# Patient Record
Sex: Male | Born: 1955 | Race: White | Hispanic: No | Marital: Married | Smoking: Never smoker
Health system: Southern US, Community
[De-identification: ages and names within clinical notes are randomized; demographics above are authoritative.]

## PROBLEM LIST (undated history)

## (undated) DIAGNOSIS — E119 Type 2 diabetes mellitus without complications: Secondary | ICD-10-CM

## (undated) DIAGNOSIS — I10 Essential (primary) hypertension: Secondary | ICD-10-CM

## (undated) DIAGNOSIS — E7800 Pure hypercholesterolemia, unspecified: Secondary | ICD-10-CM

## (undated) DIAGNOSIS — M109 Gout, unspecified: Secondary | ICD-10-CM

## (undated) DIAGNOSIS — E669 Obesity, unspecified: Secondary | ICD-10-CM

## (undated) DIAGNOSIS — N189 Chronic kidney disease, unspecified: Secondary | ICD-10-CM

## (undated) HISTORY — PX: DENTAL SURGERY: SHX609

## (undated) HISTORY — DX: Pure hypercholesterolemia, unspecified: E78.00

## (undated) HISTORY — DX: Chronic kidney disease, unspecified: N18.9

## (undated) HISTORY — DX: Essential (primary) hypertension: I10

## (undated) HISTORY — PX: CIRCUMCISION: SUR203

---

## 2012-11-26 ENCOUNTER — Emergency Department (HOSPITAL_COMMUNITY)
Admission: EM | Admit: 2012-11-26 | Discharge: 2012-11-26 | Disposition: A | Discharge status: Home / Self Care | Payer: Worker's Compensation | Attending: Emergency Medicine | Admitting: Emergency Medicine

## 2012-11-26 ENCOUNTER — Emergency Department (HOSPITAL_COMMUNITY): Payer: Worker's Compensation

## 2012-11-26 ENCOUNTER — Encounter (HOSPITAL_COMMUNITY): Payer: Self-pay | Admitting: *Deleted

## 2012-11-26 DIAGNOSIS — E669 Obesity, unspecified: Secondary | ICD-10-CM | POA: Insufficient documentation

## 2012-11-26 DIAGNOSIS — R0789 Other chest pain: Secondary | ICD-10-CM

## 2012-11-26 DIAGNOSIS — R1013 Epigastric pain: Secondary | ICD-10-CM

## 2012-11-26 DIAGNOSIS — Z7982 Long term (current) use of aspirin: Secondary | ICD-10-CM | POA: Insufficient documentation

## 2012-11-26 DIAGNOSIS — M109 Gout, unspecified: Secondary | ICD-10-CM | POA: Insufficient documentation

## 2012-11-26 DIAGNOSIS — Z79899 Other long term (current) drug therapy: Secondary | ICD-10-CM | POA: Insufficient documentation

## 2012-11-26 DIAGNOSIS — R072 Precordial pain: Secondary | ICD-10-CM | POA: Insufficient documentation

## 2012-11-26 DIAGNOSIS — I10 Essential (primary) hypertension: Secondary | ICD-10-CM | POA: Insufficient documentation

## 2012-11-26 HISTORY — DX: Obesity, unspecified: E66.9

## 2012-11-26 HISTORY — DX: Essential (primary) hypertension: I10

## 2012-11-26 HISTORY — DX: Gout, unspecified: M10.9

## 2012-11-26 LAB — POCT I-STAT TROPONIN I: Troponin i, poc: 0.01 ng/mL (ref 0.00–0.08)

## 2012-11-26 LAB — CBC
Hemoglobin: 15.6 g/dL (ref 13.0–17.0)
MCHC: 35.3 g/dL (ref 30.0–36.0)
WBC: 15.1 10*3/uL — ABNORMAL HIGH (ref 4.0–10.5)

## 2012-11-26 LAB — BASIC METABOLIC PANEL
Chloride: 101 mEq/L (ref 96–112)
GFR calc Af Amer: 42 mL/min — ABNORMAL LOW (ref >90)
GFR calc non Af Amer: 36 mL/min — ABNORMAL LOW (ref >90)
Glucose, Bld: 110 mg/dL — ABNORMAL HIGH (ref 70–99)
Potassium: 4.4 mEq/L (ref 3.5–5.1)
Sodium: 135 mEq/L (ref 135–145)

## 2012-11-26 MED ORDER — PANTOPRAZOLE SODIUM 20 MG PO TBEC: 20.0000 mg | DELAYED_RELEASE_TABLET | Freq: Every day | ORAL | Status: DC

## 2012-11-26 MED ORDER — SODIUM CHLORIDE 0.9 % IV BOLUS (SEPSIS)
500.0000 mL | Freq: Once | INTRAVENOUS | Status: AC
  Administered 2012-11-26: 500 mL via INTRAVENOUS

## 2012-11-26 MED ORDER — GI COCKTAIL ~~LOC~~
30.0000 mL | Freq: Once | ORAL | Status: AC
  Administered 2012-11-26: 30 mL via ORAL
  Filled 2012-11-26: qty 30

## 2012-11-26 NOTE — ED Provider Notes (Signed)
CSN: [id number]     Arrival date & time 11/26/12  1134 History   First MD Initiated Contact with Patient 11/26/12 1244     Chief Complaint  Patient presents with  . Chest Pain   (Consider location/radiation/quality/duration/timing/severity/associated sxs/prior Treatment) HPI   57 year old obese male with history of hypertension, and gout presents complaining of chest pain. Patient reports 4 hours ago he had an acute onset of epigastric abdominal pain/mid chest pain lasting for about 3 hours and resolved without any treatment. Pain is described as a burning sensation. Patient was diaphoretic. 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 He notified his boss man who brought pt to the ER.  Otherwise patient denies any fever, chills, headache, neck pain, shortness of breath, lightheadedness, dizziness, nausea, vomiting, diarrhea, back pain, numbness or weakness. Does have history of gout and take NSAIDs for. Denies any prior history of peptic ulcers or any significant history of cardiac disease aside from hypertension. No prior cardiac workup. No recent steroid use.  Currently completely symptom free.  Ate cheese last night.  No significant alcohol use.  Denies hematemesis.    Past Medical History  Diagnosis Date  . Obesity   . Hypertension   . Gout    History reviewed. No pertinent past surgical history. History reviewed. No pertinent family history. History  Substance Use Topics  . Smoking status: Not on file  . Smokeless tobacco: Not on file  . Alcohol Use: Yes     Comment: beer    Review of Systems  All other systems reviewed and are negative.    Allergies  Review of patient's allergies indicates no known allergies.  Home Medications   Current Outpatient Rx  Name  Route  Sig  Dispense  Refill  . allopurinol (ZYLOPRIM) 300 MG tablet   Oral   Take 300 mg by mouth daily.         Marland Kitchen aspirin (ASPIRIN EC) 81 MG EC tablet   Oral   Take 81  mg by mouth daily. Swallow whole.         Marland Kitchen atenolol (TENORMIN) 25 MG tablet   Oral   Take 25 mg by mouth daily.         . indomethacin (INDOCIN) 50 MG capsule   Oral   Take 50 mg by mouth 3 (three) times daily as needed (pain).         . valsartan-hydrochlorothiazide (DIOVAN-HCT) 320-12.5 MG per tablet   Oral   Take 1 tablet by mouth daily.          BP 103/58  Pulse 78  Temp(Src) 98 F (36.7 C) (Oral)  Resp 18  SpO2 98% Physical Exam  Nursing note and vitals reviewed. Constitutional: He appears well-developed and well-nourished. No distress.  Awake, alert, nontoxic appearance  HENT:  Head: Atraumatic.  Eyes: Conjunctivae are normal. Right eye exhibits no discharge. Left eye exhibits no discharge.  Neck: Normal range of motion. Neck supple.  Cardiovascular: Normal rate, regular rhythm and intact distal pulses.   Pulmonary/Chest: Effort normal. No respiratory distress. He exhibits no tenderness.  Abdominal: Soft. There is no tenderness. There is no rebound.  Musculoskeletal: He exhibits no edema and no tenderness.  ROM appears intact, no obvious focal weakness  Neurological: He is alert.  Skin: Skin is warm and dry. No rash noted.  Psychiatric: He has a normal mood and affect.    ED Course  Procedures (including critical care  time)   Date: 11/26/2012  Rate: 75  Rhythm: normal sinus rhythm  QRS Axis: normal  Intervals: normal  ST/T Wave abnormalities: normal  Conduction Disutrbances:none  Narrative Interpretation:   Old EKG Reviewed: none available  1:18 PM Patient presents with epigastric abdominal pain. Pain is completely resolved. Symptoms suggestive of GERD or peptic ulcer disease. Initial EKG, troponin, and chest x-ray shows no acute finding. Plan to obtain delta troponin. Patient does have abnormal kidney functions with BUN 41, creatinine 1.98, GFR 36 of 36. No prior value for baseline comparison.  Will give IVF.  Pt also has elevated WBC 15.1 but does  not have fever, chills, or any other complaint suggestive of infectious etiology.    3:40 PM Patient has remained symptom free the past 3 hours. Negative delta troponin. Plan to have patient follow closely with primary care Dr. for further evaluation. Patient will also need to have his renal function rechecked by his primary care Dr. Clista Bernhardt also need to have his white blood count rechecked to ensure resolution. Patient is aware of plan. Return precautions discussed. Otherwise patient stable for discharge. Labs Review Labs Reviewed  CBC - Abnormal; Notable for the following:    WBC 15.1 (*)    All other components within normal limits  BASIC METABOLIC PANEL - Abnormal; Notable for the following:    Glucose, Bld 110 (*)    BUN 41 (*)    Creatinine, Ser 1.98 (*)    GFR calc non Af Amer 36 (*)    GFR calc Af Amer 42 (*)    All other components within normal limits  POCT I-STAT TROPONIN I  POCT I-STAT TROPONIN I   Imaging Review Dg Chest 2 View  11/26/2012   *RADIOLOGY REPORT*  Clinical Data: Chest pain  CHEST - 2 VIEW  Comparison: None.  Findings: There is no edema or consolidation.  Lungs are slightly hyperexpanded.  Heart size and pulmonary vascularity are normal. No adenopathy.  There is degenerative change in the thoracic spine.  IMPRESSION: Lungs mildly hyperexpanded.  No edema or consolidation.   Original Report Authenticated By: Bretta Bang, M.D.    MDM   1. Chest pain, mid sternal   2. Epigastric abdominal pain    BP 103/58  Pulse 78  Temp(Src) 98 F (36.7 C) (Oral)  Resp 18  SpO2 98%  I have reviewed nursing notes and vital signs. I personally reviewed the imaging tests through PACS system  I reviewed available ER/hospitalization records thought the EMR     Fayrene Helper, [place] 11/26/12 1542

## 2012-11-26 NOTE — ED Provider Notes (Signed)
Medical screening examination/treatment/procedure(s) were conducted as a shared visit with non-physician practitioner(s) and myself.  I personally evaluated the patient during the encounter  27M with epigastric pain - burning, radiating into chest. Likely GERD. Symptom free here. EKG normal. Normal exam. Delta trop normal. Instructed f/u with PCP for stress.  Dagmar Hait, MD 11/26/12 [phone]

## 2012-11-26 NOTE — ED Notes (Signed)
Pt discharged.Vital signs stable and GCS 15 

## 2012-11-26 NOTE — ED Notes (Signed)
Reports being at work and onset of fatigue, weakness, diaphoresis, dizziness and burning mid chest pains. Denies syncope or falls. ekg done at triage. No acute distress noted.

## 2014-10-05 ENCOUNTER — Encounter (HOSPITAL_COMMUNITY): Payer: Self-pay | Admitting: Emergency Medicine

## 2014-10-05 ENCOUNTER — Emergency Department (HOSPITAL_COMMUNITY)
Admission: EM | Admit: 2014-10-05 | Discharge: 2014-10-05 | Disposition: A | Discharge status: Home / Self Care | Payer: PRIVATE HEALTH INSURANCE | Attending: Emergency Medicine | Admitting: Emergency Medicine

## 2014-10-05 ENCOUNTER — Emergency Department (HOSPITAL_COMMUNITY): Payer: PRIVATE HEALTH INSURANCE

## 2014-10-05 DIAGNOSIS — R109 Unspecified abdominal pain: Secondary | ICD-10-CM

## 2014-10-05 DIAGNOSIS — Z79899 Other long term (current) drug therapy: Secondary | ICD-10-CM | POA: Diagnosis not present

## 2014-10-05 DIAGNOSIS — M109 Gout, unspecified: Secondary | ICD-10-CM | POA: Diagnosis not present

## 2014-10-05 DIAGNOSIS — E669 Obesity, unspecified: Secondary | ICD-10-CM | POA: Insufficient documentation

## 2014-10-05 DIAGNOSIS — Z7982 Long term (current) use of aspirin: Secondary | ICD-10-CM | POA: Insufficient documentation

## 2014-10-05 DIAGNOSIS — M47818 Spondylosis without myelopathy or radiculopathy, sacral and sacrococcygeal region: Secondary | ICD-10-CM

## 2014-10-05 DIAGNOSIS — I10 Essential (primary) hypertension: Secondary | ICD-10-CM | POA: Insufficient documentation

## 2014-10-05 DIAGNOSIS — M1990 Unspecified osteoarthritis, unspecified site: Secondary | ICD-10-CM | POA: Diagnosis not present

## 2014-10-05 LAB — BASIC METABOLIC PANEL
Anion gap: 7 (ref 5–15)
BUN: 30 mg/dL — AB (ref 6–20)
CALCIUM: 9.4 mg/dL (ref 8.9–10.3)
CHLORIDE: 108 mmol/L (ref 101–111)
CO2: 23 mmol/L (ref 22–32)
CREATININE: 1.07 mg/dL (ref 0.61–1.24)
GFR calc Af Amer: >60 mL/min (ref >60)
GLUCOSE: 119 mg/dL — AB (ref 65–99)
POTASSIUM: 4.3 mmol/L (ref 3.5–5.1)
SODIUM: 138 mmol/L (ref 135–145)

## 2014-10-05 LAB — URINALYSIS, ROUTINE W REFLEX MICROSCOPIC
Bilirubin Urine: NEGATIVE
Glucose, UA: NEGATIVE mg/dL
HGB URINE DIPSTICK: NEGATIVE
KETONES UR: NEGATIVE mg/dL
Leukocytes, UA: NEGATIVE
NITRITE: NEGATIVE
PH: 5 (ref 5.0–8.0)
Protein, ur: NEGATIVE mg/dL
SPECIFIC GRAVITY, URINE: 1.021 (ref 1.005–1.030)
UROBILINOGEN UA: 0.2 mg/dL (ref 0.0–1.0)

## 2014-10-05 LAB — CBC WITH DIFFERENTIAL/PLATELET
BASOS PCT: 0 % (ref 0–1)
Basophils Absolute: 0 10*3/uL (ref 0.0–0.1)
Eosinophils Absolute: 0.3 10*3/uL (ref 0.0–0.7)
Eosinophils Relative: 4 % (ref 0–5)
HEMATOCRIT: 45.7 % (ref 39.0–52.0)
Hemoglobin: 14.9 g/dL (ref 13.0–17.0)
LYMPHS ABS: 1.6 10*3/uL (ref 0.7–4.0)
LYMPHS PCT: 20 % (ref 12–46)
MCH: 30.2 pg (ref 26.0–34.0)
MCHC: 32.6 g/dL (ref 30.0–36.0)
MCV: 92.7 fL (ref 78.0–100.0)
MONOS PCT: 11 % (ref 3–12)
Monocytes Absolute: 0.9 10*3/uL (ref 0.1–1.0)
NEUTROS ABS: 5.2 10*3/uL (ref 1.7–7.7)
Neutrophils Relative %: 65 % (ref 43–77)
PLATELETS: 148 10*3/uL — AB (ref 150–400)
RBC: 4.93 MIL/uL (ref 4.22–5.81)
RDW: 13.9 % (ref 11.5–15.5)
WBC: 8.1 10*3/uL (ref 4.0–10.5)

## 2014-10-05 MED ORDER — IBUPROFEN 600 MG PO TABS: 600.0000 mg | ORAL_TABLET | Freq: Three times a day (TID) | ORAL | Status: DC | PRN

## 2014-10-05 MED ORDER — MORPHINE SULFATE 4 MG/ML IJ SOLN: 4.0000 mg | Freq: Once | INTRAMUSCULAR | Status: DC

## 2014-10-05 NOTE — ED Notes (Signed)
Pt c/o left flank pain, has known kidney stones. States urologist couldn't do CT until tomorrow but pain is too much.

## 2014-10-05 NOTE — ED Notes (Signed)
Patient in radiology

## 2014-10-05 NOTE — ED Notes (Signed)
MD at bedside. EDP CAMPOS

## 2014-10-05 NOTE — ED Notes (Signed)
Unable to collect labs patient is not in room 

## 2014-10-05 NOTE — ED Provider Notes (Signed)
CSN: [id number]     Arrival date & time 10/05/14  1239 History   First MD Initiated Contact with Patient 10/05/14 1527     Chief Complaint  Patient presents with  . Flank Pain      HPI Patient reports left flank pain over the past 24 hours.  He states it is worse when he ambulates and sits forward.  He denies nausea vomiting.  No fevers or chills.  Denies dysuria or urinary frequency.  He states he has a known kidney stone in his left kidney for which he's been following for some time.  His pain is moderate in severity.  He tried ibuprofen without improvement.   Past Medical History  Diagnosis Date  . Obesity   . Hypertension   . Gout    History reviewed. No pertinent past surgical history. History reviewed. No pertinent family history. History  Substance Use Topics  . Smoking status: Not on file  . Smokeless tobacco: Not on file  . Alcohol Use: Yes     Comment: beer    Review of Systems  All other systems reviewed and are negative.     Allergies  Review of patient's allergies indicates no known allergies.  Home Medications   Prior to Admission medications   Medication Sig Start Date End Date Taking? Authorizing Provider  allopurinol (ZYLOPRIM) 300 MG tablet Take 300 mg by mouth daily.   Yes Historical Provider, MD  aspirin (ASPIRIN EC) 81 MG EC tablet Take 81 mg by mouth daily. Swallow whole.   Yes Historical Provider, MD  atenolol (TENORMIN) 25 MG tablet Take 25 mg by mouth daily.   Yes Historical Provider, MD  indomethacin (INDOCIN) 50 MG capsule Take 50 mg by mouth 3 (three) times daily as needed (pain).   Yes Historical Provider, MD  valsartan-hydrochlorothiazide (DIOVAN-HCT) 320-12.5 MG per tablet Take 1 tablet by mouth daily.   Yes Historical Provider, MD  ibuprofen (ADVIL,MOTRIN) 600 MG tablet Take 1 tablet (600 mg total) by mouth every 8 (eight) hours as needed. 10/05/14   Jola Schmidt, MD  pantoprazole (PROTONIX) 20 MG tablet Take 1 tablet (20 mg total) by  mouth daily. Patient not taking: Reported on 10/05/2014 11/26/12   Domenic Moras, PA-C   BP 116/66 mmHg  Pulse 61  Temp(Src) 97.9 F (36.6 C) (Oral)  Resp 18  SpO2 99% Physical Exam  Constitutional: He is oriented to person, place, and time. He appears well-developed and well-nourished.  HENT:  Head: Normocephalic and atraumatic.  Eyes: EOM are normal.  Neck: Normal range of motion.  Cardiovascular: Normal rate, regular rhythm, normal heart sounds and intact distal pulses.   Pulmonary/Chest: Effort normal and breath sounds normal. No respiratory distress.  Abdominal: Soft. He exhibits no distension. There is no tenderness.  Musculoskeletal: Normal range of motion.  Neurological: He is alert and oriented to person, place, and time.  Skin: Skin is warm and dry.  Psychiatric: He has a normal mood and affect. Judgment normal.  Nursing note and vitals reviewed.   ED Course  Procedures (including critical care time) Labs Review Labs Reviewed  CBC WITH DIFFERENTIAL/PLATELET - Abnormal; Notable for the following:    Platelets 148 (*)    All other components within normal limits  BASIC METABOLIC PANEL - Abnormal; Notable for the following:    Glucose, Bld 119 (*)    BUN 30 (*)    All other components within normal limits  URINALYSIS, ROUTINE W REFLEX MICROSCOPIC (NOT AT [facility])  Imaging Review Ct Renal Stone Study  10/05/2014   CLINICAL DATA:  Left flank pain.  Renal calculi.  EXAM: CT ABDOMEN AND PELVIS WITHOUT CONTRAST  TECHNIQUE: Multidetector CT imaging of the abdomen and pelvis was performed following the standard protocol without IV contrast.  COMPARISON:  Multiple exams, including 09/06/2011 and 01/21/2011  FINDINGS: Lower chest:  Unremarkable  Hepatobiliary: Diffuse hepatic steatosis.  Pancreas: Unremarkable  Spleen: Unremarkable  Adrenals/Urinary Tract: 4 mm right mid to lower kidney nonobstructive calculus. No additional calculi. No hydronephrosis or hydroureter. Urinary bladder  unremarkable.  Stomach/Bowel: Unremarkable.  Appendix normal.  Vascular/Lymphatic: Aortoiliac atherosclerotic vascular disease.  Reproductive: Unremarkable  Other: No supplemental non-categorized findings.  Musculoskeletal: Bridging spurring of the sacroiliac joints. Bilateral facet arthropathy at L5-S1, no overt lumbar impingement.  IMPRESSION: 1. There is a 4 mm right mid to lower kidney nonobstructive calculus but no other calculi are identified. No hydronephrosis, hydroureter, or other findings to explain the patient's left flank pain. 2. Diffuse hepatic steatosis. 3.  Aortoiliac atherosclerotic vascular disease. 4. Spurring of the sacroiliac joints and bilateral facet arthropathy at L5-S1.   Electronically Signed   By: Van Clines M.D.   On: 10/05/2014 16:15  I personally reviewed the imaging tests through PACS system I reviewed available ER/hospitalization records through the EMR    EKG Interpretation None      MDM   Final diagnoses:  Left flank pain  SI joint arthritis    Likely arthritis of his SI joint is a cause of his pain given that is worse with ambulation and movement.  Nephrolithiasis noted without ureterolithiasis.  Well appearing.  Vitals normal.  Labs and urine without significant abnormality.  Primary care follow-up.  Patient understands to return to the ER for new or worsening symptoms    Jola Schmidt, MD 10/06/14 [phone]

## 2014-10-05 NOTE — ED Notes (Signed)
Patient still not in room

## 2016-12-11 ENCOUNTER — Encounter (INDEPENDENT_AMBULATORY_CARE_PROVIDER_SITE_OTHER): Payer: Self-pay

## 2016-12-11 ENCOUNTER — Encounter (INDEPENDENT_AMBULATORY_CARE_PROVIDER_SITE_OTHER): Payer: Self-pay | Admitting: Internal Medicine

## 2017-01-08 ENCOUNTER — Encounter (INDEPENDENT_AMBULATORY_CARE_PROVIDER_SITE_OTHER): Payer: Self-pay | Admitting: Internal Medicine

## 2017-01-08 ENCOUNTER — Other Ambulatory Visit (INDEPENDENT_AMBULATORY_CARE_PROVIDER_SITE_OTHER): Payer: Self-pay | Admitting: Internal Medicine

## 2017-01-08 ENCOUNTER — Ambulatory Visit (INDEPENDENT_AMBULATORY_CARE_PROVIDER_SITE_OTHER): Payer: PRIVATE HEALTH INSURANCE | Admitting: Internal Medicine

## 2017-01-08 VITALS — BP 150/80 | HR 76 | Temp 98.2°F | Ht 74.0 in | Wt 216.0 lb

## 2017-01-08 DIAGNOSIS — I10 Essential (primary) hypertension: Secondary | ICD-10-CM | POA: Insufficient documentation

## 2017-01-08 DIAGNOSIS — R195 Other fecal abnormalities: Secondary | ICD-10-CM | POA: Diagnosis not present

## 2017-01-08 DIAGNOSIS — E7800 Pure hypercholesterolemia, unspecified: Secondary | ICD-10-CM

## 2017-01-08 DIAGNOSIS — N189 Chronic kidney disease, unspecified: Secondary | ICD-10-CM

## 2017-01-08 DIAGNOSIS — Z8371 Family history of colonic polyps: Secondary | ICD-10-CM

## 2017-01-08 HISTORY — DX: Chronic kidney disease, unspecified: N18.9

## 2017-01-08 HISTORY — DX: Pure hypercholesterolemia, unspecified: E78.00

## 2017-01-08 HISTORY — DX: Essential (primary) hypertension: I10

## 2017-01-08 LAB — CBC WITH DIFFERENTIAL/PLATELET
BASOS PCT: 0.8 %
Basophils Absolute: 77 cells/uL (ref 0–200)
EOS ABS: 298 {cells}/uL (ref 15–500)
Eosinophils Relative: 3.1 %
HCT: 46.8 % (ref 38.5–50.0)
HEMOGLOBIN: 15.8 g/dL (ref 13.2–17.1)
LYMPHS ABS: 2208 {cells}/uL (ref 850–3900)
MCH: 30.3 pg (ref 27.0–33.0)
MCHC: 33.8 g/dL (ref 32.0–36.0)
MCV: 89.7 fL (ref 80.0–100.0)
MPV: 11.2 fL (ref 7.5–12.5)
Monocytes Relative: 9.9 %
NEUTROS ABS: 6067 {cells}/uL (ref 1500–7800)
Neutrophils Relative %: 63.2 %
Platelets: 173 10*3/uL (ref 140–400)
RBC: 5.22 10*6/uL (ref 4.20–5.80)
RDW: 12.5 % (ref 11.0–15.0)
Total Lymphocyte: 23 %
WBC: 9.6 10*3/uL (ref 3.8–10.8)
WBCMIX: 950 {cells}/uL (ref 200–950)

## 2017-01-08 NOTE — Progress Notes (Addendum)
   Subjective:    Patient ID: Manuel [name], male    DOB: 1956/01/09, 61 y.o.   MRN: [id number]  HPI Referred by Dr. Manuella Ghazi for positive stool card. He denies seeing any blood in his stool. Stools are normal. No change in his stools. States stools are usually loose which is normal for him. Brother recently underwent a hemicolectomy by Dr. Arnoldo Morale for tubular adenoma with focal high grade dysplasia. His appetite is good. No weight loss. No abdominal pain (see epic) His last colonoscopy was in 2009 yrs ago by Dr. Laural Golden and was normal, except for small external hemorrhoids.  10/19/2007 Colonoscopy Dr. Rehman:Heme positive stools: Small external hemorrhoids, otherwise normal colonoscopy     Review of Systems Past Medical History:  Diagnosis Date  . CKD (chronic kidney disease) 01/08/2017   patient states he does not have.  . Essential hypertension, benign 01/08/2017  . Gout   . High cholesterol 01/08/2017  . Hypertension   . Obesity     No past surgical history on file.  No Known Allergies  Current Outpatient Prescriptions on File Prior to Visit  Medication Sig Dispense Refill  . allopurinol (ZYLOPRIM) 300 MG tablet Take 300 mg by mouth daily.    Marland Kitchen aspirin (ASPIRIN EC) 81 MG EC tablet Take 81 mg by mouth daily. Swallow whole.    Marland Kitchen atenolol (TENORMIN) 25 MG tablet Take 25 mg by mouth daily.    . indomethacin (INDOCIN) 50 MG capsule Take 50 mg by mouth 3 (three) times daily as needed (pain).    Marland Kitchen ibuprofen (ADVIL,MOTRIN) 600 MG tablet Take 1 tablet (600 mg total) by mouth every 8 (eight) hours as needed. 15 tablet 0  . pantoprazole (PROTONIX) 20 MG tablet Take 1 tablet (20 mg total) by mouth daily. (Patient not taking: Reported on 10/05/2014) 30 tablet 0  . valsartan-hydrochlorothiazide (DIOVAN-HCT) 320-12.5 MG per tablet Take 1 tablet by mouth daily.     No current facility-administered medications on file prior to visit.         Objective:   Physical Exam Blood pressure (!)  150/80, pulse 76, temperature 98.2 F (36.8 C), height 6\' 2"  (1.88 m), weight 216 lb (98 kg). Alert and oriented. Skin warm and dry. Oral mucosa is moist.   . Sclera anicteric, conjunctivae is pink. Thyroid not enlarged. No cervical lymphadenopathy. Lungs clear. Heart regular rate and rhythm.  Abdomen is soft. Bowel sounds are positive. No hepatomegaly. No abdominal masses felt. No tenderness.  No edema to lower extremities.           Assessment & Plan:  Guaiac + stool. Family hx of colon polyps. Diagnostic  High risk colonoscopy.  CBC today.

## 2017-01-08 NOTE — Patient Instructions (Addendum)
Colonoscopy. The risks of bleeding, perforation and infection were reviewed with patient. CBC today.

## 2017-01-09 ENCOUNTER — Encounter (INDEPENDENT_AMBULATORY_CARE_PROVIDER_SITE_OTHER): Payer: Self-pay | Admitting: *Deleted

## 2017-01-09 ENCOUNTER — Telehealth (INDEPENDENT_AMBULATORY_CARE_PROVIDER_SITE_OTHER): Payer: Self-pay | Admitting: *Deleted

## 2017-01-09 DIAGNOSIS — R195 Other fecal abnormalities: Secondary | ICD-10-CM | POA: Insufficient documentation

## 2017-01-09 DIAGNOSIS — Z8371 Family history of colonic polyps: Secondary | ICD-10-CM | POA: Insufficient documentation

## 2017-01-09 MED ORDER — PEG 3350-KCL-NA BICARB-NACL 420 G PO SOLR: 4000.0000 mL | Freq: Once | ORAL | 0 refills | Status: AC

## 2017-01-09 NOTE — Telephone Encounter (Signed)
Patient needs trilyte 

## 2017-01-14 ENCOUNTER — Encounter (HOSPITAL_COMMUNITY)
Admission: RE | Disposition: A | Discharge status: Home / Self Care | Payer: Self-pay | Source: Ambulatory Visit | Attending: Internal Medicine

## 2017-01-14 ENCOUNTER — Encounter (HOSPITAL_COMMUNITY): Payer: Self-pay

## 2017-01-14 ENCOUNTER — Ambulatory Visit (HOSPITAL_COMMUNITY)
Admission: RE | Admit: 2017-01-14 | Discharge: 2017-01-14 | Disposition: A | Discharge status: Home / Self Care | Payer: PRIVATE HEALTH INSURANCE | Source: Ambulatory Visit | Attending: Internal Medicine | Admitting: Internal Medicine

## 2017-01-14 DIAGNOSIS — Z79899 Other long term (current) drug therapy: Secondary | ICD-10-CM | POA: Diagnosis not present

## 2017-01-14 DIAGNOSIS — Z7982 Long term (current) use of aspirin: Secondary | ICD-10-CM | POA: Insufficient documentation

## 2017-01-14 DIAGNOSIS — E669 Obesity, unspecified: Secondary | ICD-10-CM | POA: Insufficient documentation

## 2017-01-14 DIAGNOSIS — I129 Hypertensive chronic kidney disease with stage 1 through stage 4 chronic kidney disease, or unspecified chronic kidney disease: Secondary | ICD-10-CM | POA: Insufficient documentation

## 2017-01-14 DIAGNOSIS — N189 Chronic kidney disease, unspecified: Secondary | ICD-10-CM | POA: Insufficient documentation

## 2017-01-14 DIAGNOSIS — E7800 Pure hypercholesterolemia, unspecified: Secondary | ICD-10-CM | POA: Diagnosis not present

## 2017-01-14 DIAGNOSIS — M109 Gout, unspecified: Secondary | ICD-10-CM | POA: Insufficient documentation

## 2017-01-14 DIAGNOSIS — Z8371 Family history of colonic polyps: Secondary | ICD-10-CM | POA: Insufficient documentation

## 2017-01-14 DIAGNOSIS — Z800 Family history of malignant neoplasm of digestive organs: Secondary | ICD-10-CM | POA: Diagnosis not present

## 2017-01-14 DIAGNOSIS — K921 Melena: Secondary | ICD-10-CM | POA: Insufficient documentation

## 2017-01-14 DIAGNOSIS — K644 Residual hemorrhoidal skin tags: Secondary | ICD-10-CM | POA: Insufficient documentation

## 2017-01-14 DIAGNOSIS — R195 Other fecal abnormalities: Secondary | ICD-10-CM | POA: Diagnosis not present

## 2017-01-14 DIAGNOSIS — D120 Benign neoplasm of cecum: Secondary | ICD-10-CM

## 2017-01-14 HISTORY — PX: COLONOSCOPY: SHX5424

## 2017-01-14 SURGERY — COLONOSCOPY
Anesthesia: Moderate Sedation

## 2017-01-14 MED ORDER — MEPERIDINE HCL 50 MG/ML IJ SOLN
INTRAMUSCULAR | Status: DC | PRN
  Administered 2017-01-14 (×2): 25 mg via INTRAVENOUS

## 2017-01-14 MED ORDER — MEPERIDINE HCL 50 MG/ML IJ SOLN
INTRAMUSCULAR | Status: AC
  Filled 2017-01-14: qty 1

## 2017-01-14 MED ORDER — STERILE WATER FOR IRRIGATION IR SOLN
Status: DC | PRN
  Administered 2017-01-14: 08:00:00

## 2017-01-14 MED ORDER — MIDAZOLAM HCL 5 MG/5ML IJ SOLN
INTRAMUSCULAR | Status: DC | PRN
  Administered 2017-01-14: 1 mg via INTRAVENOUS
  Administered 2017-01-14 (×3): 2 mg via INTRAVENOUS

## 2017-01-14 MED ORDER — MIDAZOLAM HCL 5 MG/5ML IJ SOLN
INTRAMUSCULAR | Status: AC
  Filled 2017-01-14: qty 10

## 2017-01-14 MED ORDER — SODIUM CHLORIDE 0.9 % IV SOLN
INTRAVENOUS | Status: DC
  Administered 2017-01-14: 07:00:00 via INTRAVENOUS

## 2017-01-14 NOTE — OR Nursing (Signed)
Manuel [name] had a procedure at [facility] on 01/14/17 and cannot return to work until 9:00am on 01/15/17.

## 2017-01-14 NOTE — Op Note (Signed)
[facility] Patient Name: Manuel [name] Procedure Date: 01/14/2017 7:03 AM MRN: [id number] Date of Birth: 05-Nov-1955 Attending MD: Hildred Laser , MD CSN: [id number] Age: 61 Admit Type: Outpatient Procedure:                Colonoscopy Indications:              Heme positive stool Providers:                Hildred Laser, MD, Janeece Riggers, RN, Aram Candela Referring MD:             Monico Blitz, MD Medicines:                Meperidine 50 mg IV, Midazolam 7 mg IV Complications:            No immediate complications. Estimated Blood Loss:     Estimated blood loss was minimal. Procedure:                Pre-Anesthesia Assessment:                           - Prior to the procedure, a History and Physical                            was performed, and patient medications and                            allergies were reviewed. The patient's tolerance of                            previous anesthesia was also reviewed. The risks                            and benefits of the procedure and the sedation                            options and risks were discussed with the patient.                            All questions were answered, and informed consent                            was obtained. Prior Anticoagulants: The patient                            last took aspirin 2 days prior to the procedure.                            ASA Grade Assessment: II - A patient with mild                            systemic disease. After reviewing the risks and                            benefits, the patient was deemed in satisfactory  condition to undergo the procedure.                           After obtaining informed consent, the colonoscope                            was passed under direct vision. Throughout the                            procedure, the patient's blood pressure, pulse, and                            oxygen saturations were monitored continuously. The                         EC-[id number] ([id number]) was introduced through the                            anus and advanced to the the cecum, identified by                            appendiceal orifice and ileocecal valve. The                            colonoscopy was performed without difficulty. The                            patient tolerated the procedure well. The quality                            of the bowel preparation was good. The ileocecal                            valve, appendiceal orifice, and rectum were                            photographed. Scope In: 7:42:44 AM Scope Out: 8:01:37 AM Scope Withdrawal Time: 0 hours 13 minutes 36 seconds  Total Procedure Duration: 0 hours 18 minutes 53 seconds  Findings:      The perianal and digital rectal examinations were normal.      Two sessile polyps were found in the cecum. The polyps were small in       size. These were biopsied with a cold forceps for histology. The       pathology specimen was placed into Bottle Number 1.      The exam was otherwise normal throughout the examined colon.      External hemorrhoids were found during retroflexion. The hemorrhoids       were small. Impression:               - Two small polyps in the cecum. Biopsied.                           - External hemorrhoids. Moderate Sedation:      Moderate (conscious) sedation was administered by the endoscopy nurse  and supervised by the endoscopist. The following parameters were       monitored: oxygen saturation, heart rate, blood pressure, CO2       capnography and response to care. Total physician intraservice time was       25 minutes. Recommendation:           - Patient has a contact number available for                            emergencies. The signs and symptoms of potential                            delayed complications were discussed with the                            patient. Return to normal activities tomorrow.                             Written discharge instructions were provided to the                            patient.                           - Resume previous diet today.                           - Continue present medications.                           - Resume aspirin at prior dose tomorrow.                           - Await pathology results.                           - Repeat colonoscopy date to be determined after                            pending pathology results are reviewed. Procedure Code(s):        --- Professional ---                           [phone], Colonoscopy, flexible; with biopsy, single                            or multiple                           99152, Moderate sedation services provided by the                            same physician or other qualified health care                            professional performing the diagnostic or  therapeutic service that the sedation supports,                            requiring the presence of an independent trained                            observer to assist in the monitoring of the                            patient's level of consciousness and physiological                            status; initial 15 minutes of intraservice time,                            patient age 88 years or older                           [phone], Moderate sedation services; each additional                            15 minutes intraservice time Diagnosis Code(s):        --- Professional ---                           D12.0, Benign neoplasm of cecum                           K64.4, Residual hemorrhoidal skin tags                           R19.5, Other fecal abnormalities CPT copyright 2016 American Medical Association. All rights reserved. The codes documented in this report are preliminary and upon coder review may  be revised to meet current compliance requirements. Hildred Laser, MD Hildred Laser, MD 01/14/2017 8:10:45 AM This report has been  signed electronically. Number of Addenda: 0

## 2017-01-14 NOTE — H&P (Signed)
Manuel [name] is an 61 y.o. male.   Chief Complaint: Patient is here for colonoscopy. HPI: Patient is 61 -year-old Caucasian male who was noted to have heme-positive stool on recent testing by PCP. Patient denies rectal bleeding melena abdominal pain or change in bowel habits. Last colonoscopy was in 2009 revealing external hemorrhoids. Family history is negative for CRC but his brother had right, colectomy for large polyp at high-grade dysplasia few years ago.  Past Medical History:  Diagnosis Date  . CKD (chronic kidney disease) 01/08/2017   patient states he does not have.    01/08/2017  . Gout   . High cholesterol 01/08/2017  . Hypertension   . Obesity     Past Surgical History:  Procedure Laterality Date  . DENTAL SURGERY     teeth removed    Family History  Problem Relation Age of Onset  . Colon cancer Brother    Social History:  reports that he has never smoked. He has never used smokeless tobacco. He reports that he drinks alcohol. He reports that he does not use drugs.  Allergies: No Known Allergies  Medications Prior to Admission  Medication Sig Dispense Refill  . allopurinol (ZYLOPRIM) 300 MG tablet Take 300 mg by mouth daily.    Marland Kitchen aspirin (ASPIRIN EC) 81 MG EC tablet Take 81 mg by mouth daily. Swallow whole.    Marland Kitchen aspirin EC 325 MG tablet Take 325-650 mg by mouth every 8 (eight) hours as needed (for pain.).    Marland Kitchen atenolol (TENORMIN) 25 MG tablet Take 25 mg by mouth daily.    Marland Kitchen CINNAMON PO Take 1,000 mg by mouth 2 (two) times daily.    Marland Kitchen ibuprofen (ADVIL,MOTRIN) 200 MG tablet Take 400 mg by mouth every 8 (eight) hours as needed (for pain.).    Marland Kitchen indomethacin (INDOCIN) 50 MG capsule Take 50 mg by mouth 3 (three) times daily as needed (FOR GOUT FLARES UP).     Marland Kitchen losartan (COZAAR) 100 MG tablet Take 100 mg by mouth daily.    . valsartan-hydrochlorothiazide (DIOVAN-HCT) 320-12.5 MG per tablet Take 1 tablet by mouth daily.      No results found for this or any previous  visit (from the past 48 hour(s)). No results found.  ROS  Blood pressure 129/73, pulse 66, temperature 97.8 F (36.6 C), temperature source Oral, resp. rate 15, SpO2 97 %. Physical Exam  Constitutional: He appears well-developed and well-nourished.  HENT:  Mouth/Throat: Oropharynx is clear and moist.  Eyes: Conjunctivae are normal. No scleral icterus.  Neck: No thyromegaly present.  Cardiovascular:  Regular rhythm normal S1 and S2. Grade 2/6 systolic murmur noted at left sternal border.  Respiratory: Effort normal and breath sounds normal.  GI:  Abdomen is full. It is soft and nontender without organomegaly or masses.  Musculoskeletal: He exhibits edema (trace edema around ankles.).  Lymphadenopathy:    He has no cervical adenopathy.  Neurological: He is alert.  Skin: Skin is warm and dry.     Assessment/Plan  Heme positive stool. Diagnostic colonoscopy.  Hildred Laser, MD 01/14/2017, 7:31 AM

## 2017-01-14 NOTE — Discharge Instructions (Signed)
Resume aspirin on 01/15/2017. Resume other medications and diet as before. No driving for 24 hours. Physician will call with biopsy results.   Colonoscopy, Adult, Care After This sheet gives you information about how to care for yourself after your procedure. Your doctor may also give you more specific instructions. If you have problems or questions, call your doctor. Follow these instructions at home: General instructions   For the first 24 hours after the procedure: ? Do not drive or use machinery. ? Do not sign important documents. ? Do not drink alcohol. ? Do your daily activities more slowly than normal. ? Eat foods that are soft and easy to digest. ? Rest often.  Take over-the-counter or prescription medicines only as told by your doctor.  It is up to you to get the results of your procedure. Ask your doctor, or the department performing the procedure, when your results will be ready. To help cramping and bloating:  Try walking around.  Put heat on your belly (abdomen) as told by your doctor. Use a heat source that your doctor recommends, such as a moist heat pack or a heating pad. ? Put a towel between your skin and the heat source. ? Leave the heat on for 20-30 minutes. ? Remove the heat if your skin turns bright red. This is especially important if you cannot feel pain, heat, or cold. You can get burned. Eating and drinking  Drink enough fluid to keep your pee (urine) clear or pale yellow.  Return to your normal diet as told by your doctor. Avoid heavy or fried foods that are hard to digest.  Avoid drinking alcohol for as long as told by your doctor. Contact a doctor if:  You have blood in your poop (stool) 2-3 days after the procedure. Get help right away if:  You have more than a small amount of blood in your poop.  You see large clumps of tissue (blood clots) in your poop.  Your belly is swollen.  You feel sick to your stomach (nauseous).  You throw up  (vomit).  You have a fever.  You have belly pain that gets worse, and medicine does not help your pain. This information is not intended to replace advice given to you by your health care provider. Make sure you discuss any questions you have with your health care provider. Document Released: 04/13/2010 Document Revised: 12/04/2015 Document Reviewed: 12/04/2015 Elsevier Interactive Patient Education  2017 [employer].  Colon Polyps Polyps are tissue growths inside the body. Polyps can grow in many places, including the large intestine (colon). A polyp may be a round bump or a mushroom-shaped growth. You could have one polyp or several. Most colon polyps are noncancerous (benign). However, some colon polyps can become cancerous over time. What are the causes? The exact cause of colon polyps is not known. What increases the risk? This condition is more likely to develop in people who:  Have a family history of colon cancer or colon polyps.  Are older than 75 or older than 45 if they are African American.  Have inflammatory bowel disease, such as ulcerative colitis or Crohn disease.  Are overweight.  Smoke cigarettes.  Do not get enough exercise.  Drink too much alcohol.  Eat a diet that is: ? High in fat and red meat. ? Low in fiber.  Had childhood cancer that was treated with abdominal radiation.  What are the signs or symptoms? Most polyps do not cause symptoms. If you have  symptoms, they may include:  Blood coming from your rectum when having a bowel movement.  Blood in your stool.The stool may look dark red or black.  A change in bowel habits, such as constipation or diarrhea.  How is this diagnosed? This condition is diagnosed with a colonoscopy. This is a procedure that uses a lighted, flexible scope to look at the inside of your colon. How is this treated? Treatment for this condition involves removing any polyps that are found. Those polyps will then be  tested for cancer. If cancer is found, your health care provider will talk to you about options for colon cancer treatment. Follow these instructions at home: Diet  Eat plenty of fiber, such as fruits, vegetables, and whole grains.  Eat foods that are high in calcium and vitamin D, such as milk, cheese, yogurt, eggs, liver, fish, and broccoli.  Limit foods high in fat, red meats, and processed meats, such as hot dogs, sausage, bacon, and lunch meats.  Maintain a healthy weight, or lose weight if recommended by your health care provider. General instructions  Do not smoke cigarettes.  Do not drink alcohol excessively.  Keep all follow-up visits as told by your health care provider. This is important. This includes keeping regularly scheduled colonoscopies. Talk to your health care provider about when you need a colonoscopy.  Exercise every day or as told by your health care provider. Contact a health care provider if:  You have new or worsening bleeding during a bowel movement.  You have new or increased blood in your stool.  You have a change in bowel habits.  You unexpectedly lose weight. This information is not intended to replace advice given to you by your health care provider. Make sure you discuss any questions you have with your health care provider. Document Released: 12/06/2003 Document Revised: 08/17/2015 Document Reviewed: 01/30/2015 Elsevier Interactive Patient Education  2018 [employer].  Colon Polyps Polyps are tissue growths inside the body. Polyps can grow in many places, including the large intestine (colon). A polyp may be a round bump or a mushroom-shaped growth. You could have one polyp or several. Most colon polyps are noncancerous (benign). However, some colon polyps can become cancerous over time. What are the causes? The exact cause of colon polyps is not known. What increases the risk? This condition is more likely to develop in people who:  Have a  family history of colon cancer or colon polyps.  Are older than 27 or older than 45 if they are African American.  Have inflammatory bowel disease, such as ulcerative colitis or Crohn disease.  Are overweight.  Smoke cigarettes.  Do not get enough exercise.  Drink too much alcohol.  Eat a diet that is: ? High in fat and red meat. ? Low in fiber.  Had childhood cancer that was treated with abdominal radiation.  What are the signs or symptoms? Most polyps do not cause symptoms. If you have symptoms, they may include:  Blood coming from your rectum when having a bowel movement.  Blood in your stool.The stool may look dark red or black.  A change in bowel habits, such as constipation or diarrhea.  How is this diagnosed? This condition is diagnosed with a colonoscopy. This is a procedure that uses a lighted, flexible scope to look at the inside of your colon. How is this treated? Treatment for this condition involves removing any polyps that are found. Those polyps will then be tested for cancer. If cancer  is found, your health care provider will talk to you about options for colon cancer treatment. Follow these instructions at home: Diet  Eat plenty of fiber, such as fruits, vegetables, and whole grains.  Eat foods that are high in calcium and vitamin D, such as milk, cheese, yogurt, eggs, liver, fish, and broccoli.  Limit foods high in fat, red meats, and processed meats, such as hot dogs, sausage, bacon, and lunch meats.  Maintain a healthy weight, or lose weight if recommended by your health care provider. General instructions  Do not smoke cigarettes.  Do not drink alcohol excessively.  Keep all follow-up visits as told by your health care provider. This is important. This includes keeping regularly scheduled colonoscopies. Talk to your health care provider about when you need a colonoscopy.  Exercise every day or as told by your health care provider. Contact a  health care provider if:  You have new or worsening bleeding during a bowel movement.  You have new or increased blood in your stool.  You have a change in bowel habits.  You unexpectedly lose weight. This information is not intended to replace advice given to you by your health care provider. Make sure you discuss any questions you have with your health care provider. Document Released: 12/06/2003 Document Revised: 08/17/2015 Document Reviewed: 01/30/2015 Elsevier Interactive Patient Education  [employer].

## 2017-01-17 ENCOUNTER — Encounter (HOSPITAL_COMMUNITY): Payer: Self-pay | Admitting: Internal Medicine

## 2017-06-23 DIAGNOSIS — D631 Anemia in chronic kidney disease: Secondary | ICD-10-CM | POA: Insufficient documentation

## 2017-06-23 DIAGNOSIS — N179 Acute kidney failure, unspecified: Secondary | ICD-10-CM | POA: Insufficient documentation

## 2017-07-04 ENCOUNTER — Other Ambulatory Visit (HOSPITAL_COMMUNITY): Payer: Self-pay | Admitting: Nephrology

## 2017-07-04 DIAGNOSIS — N183 Chronic kidney disease, stage 3 unspecified: Secondary | ICD-10-CM

## 2017-07-22 ENCOUNTER — Ambulatory Visit (HOSPITAL_COMMUNITY)
Admission: RE | Admit: 2017-07-22 | Discharge: 2017-07-22 | Disposition: A | Discharge status: Home / Self Care | Payer: PRIVATE HEALTH INSURANCE | Source: Ambulatory Visit | Attending: Nephrology | Admitting: Nephrology

## 2017-07-22 DIAGNOSIS — N183 Chronic kidney disease, stage 3 unspecified: Secondary | ICD-10-CM

## 2017-07-22 DIAGNOSIS — N200 Calculus of kidney: Secondary | ICD-10-CM | POA: Diagnosis not present

## 2018-08-12 IMAGING — US US RENAL
1 series · 14 of 25 positions shown · non-contrast
Comparison: CT abdomen pelvis of 10/05/2014

CLINICAL DATA: Chronic kidney disease stage 3, obesity

EXAM:
RENAL / URINARY TRACT ULTRASOUND COMPLETE

[Series 1: us renal · 0.26mm/px · 14 of 50 slices shown]
[im 1/50]
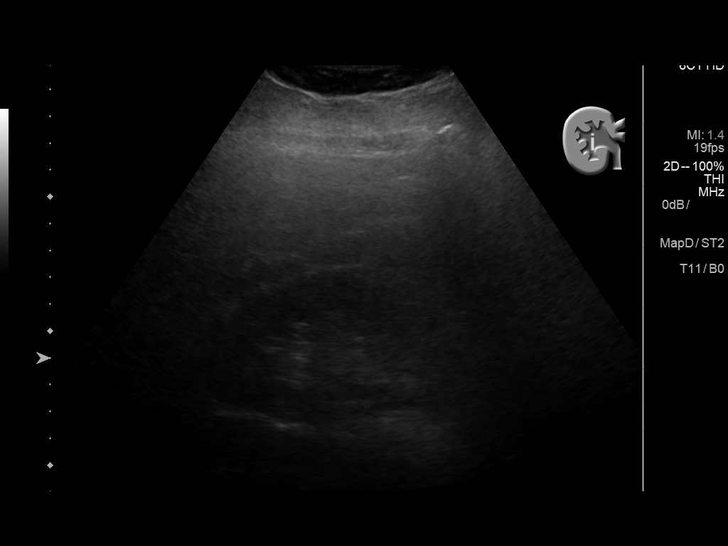
[im 5/50]
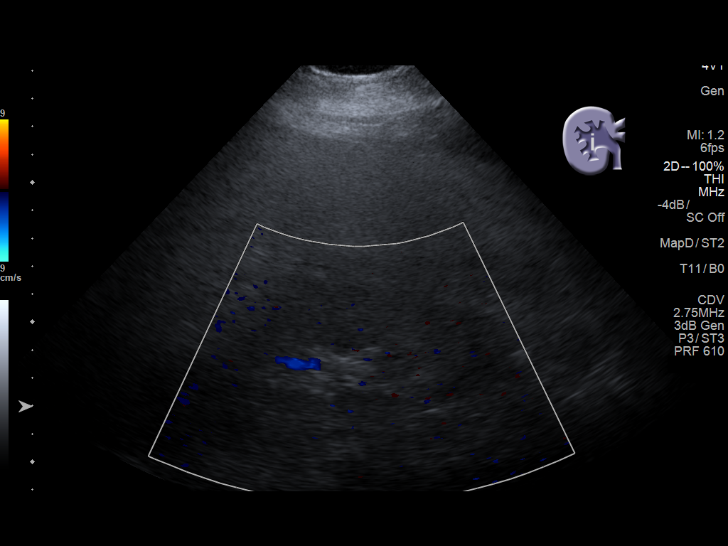
[im 9/50]
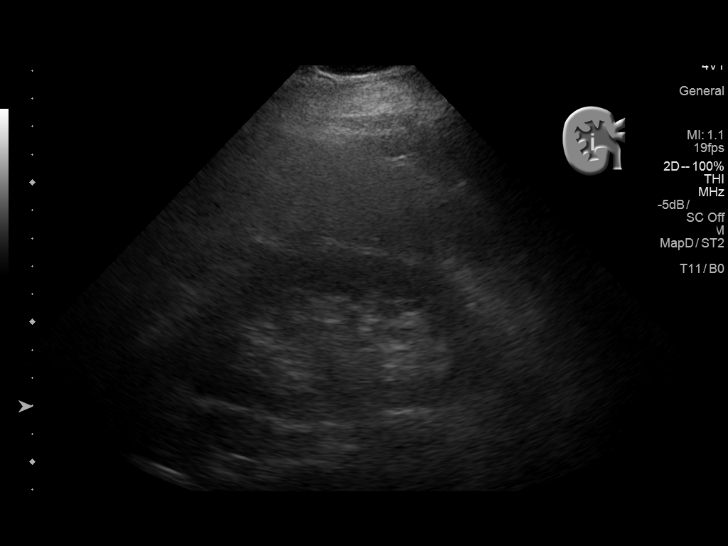
[im 13/50]
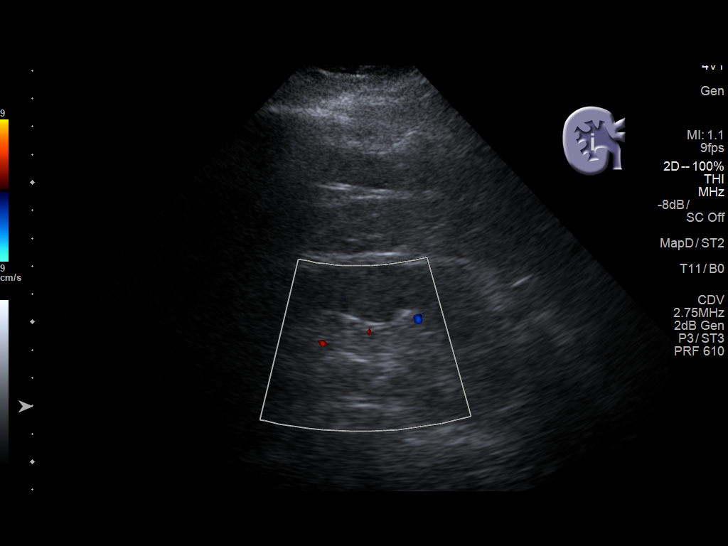
[im 17/50]
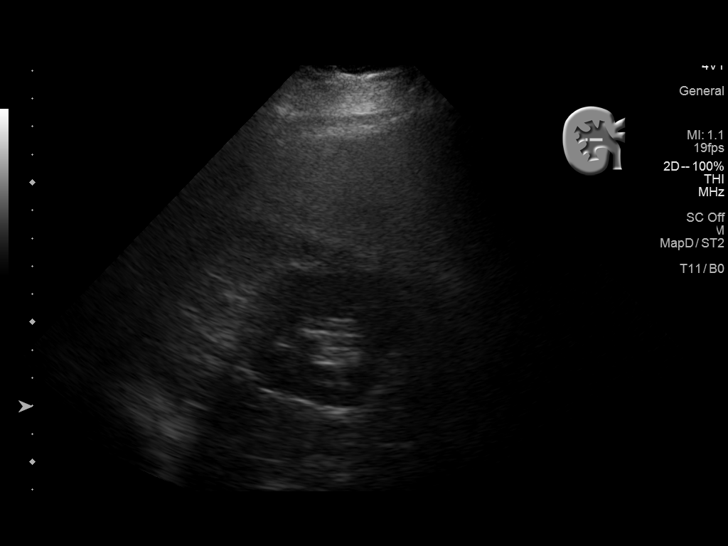
[im 19/50]
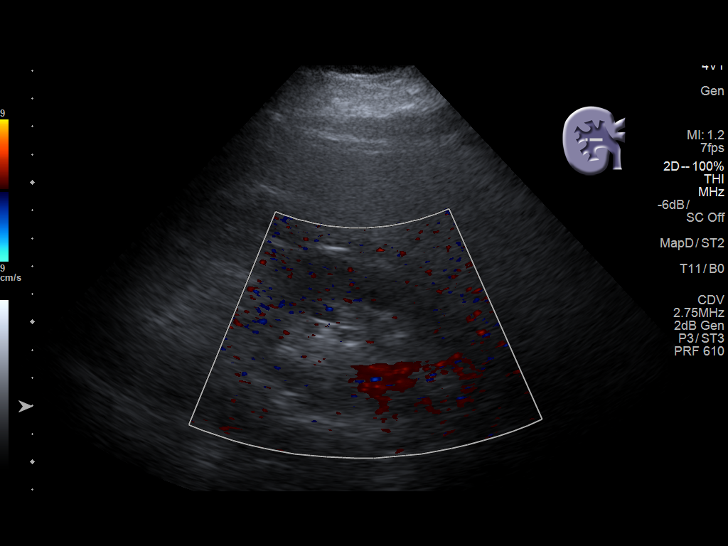
[im 23/50]
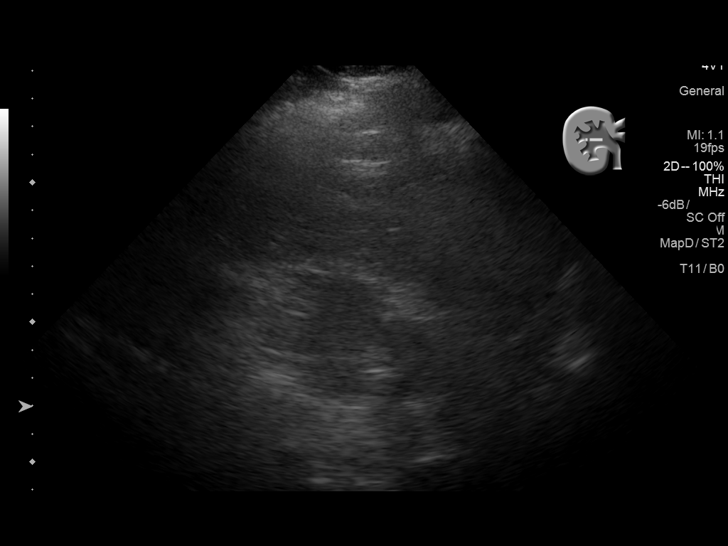
[im 27/50]
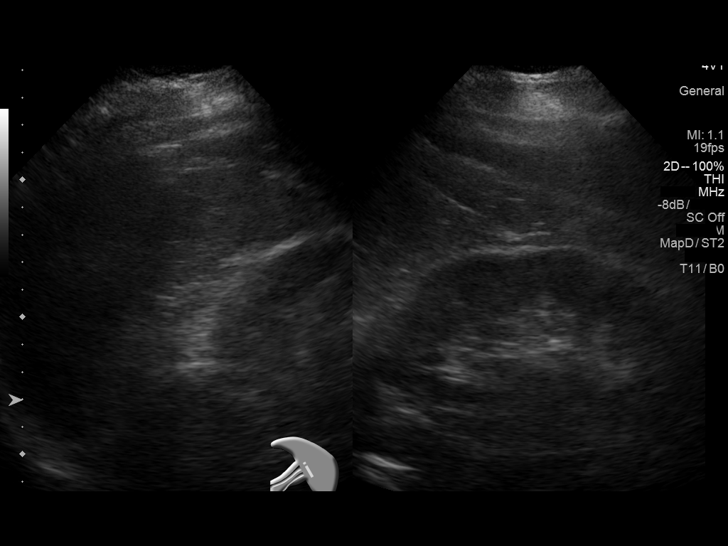
[im 31/50]
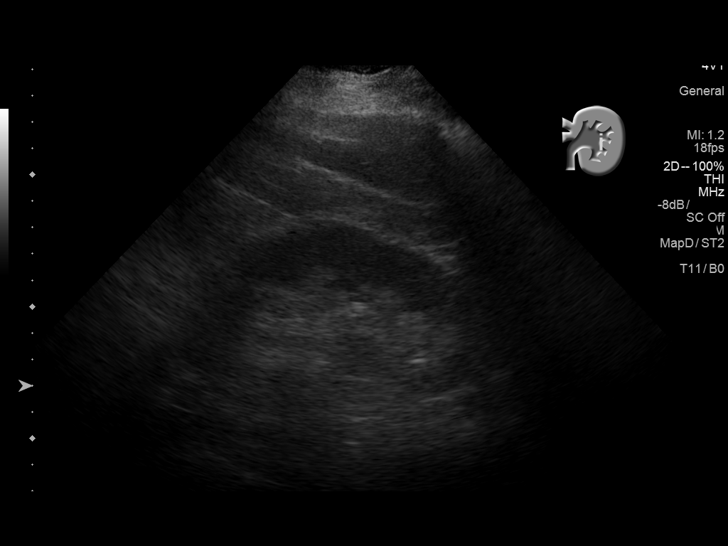
[im 33/50]
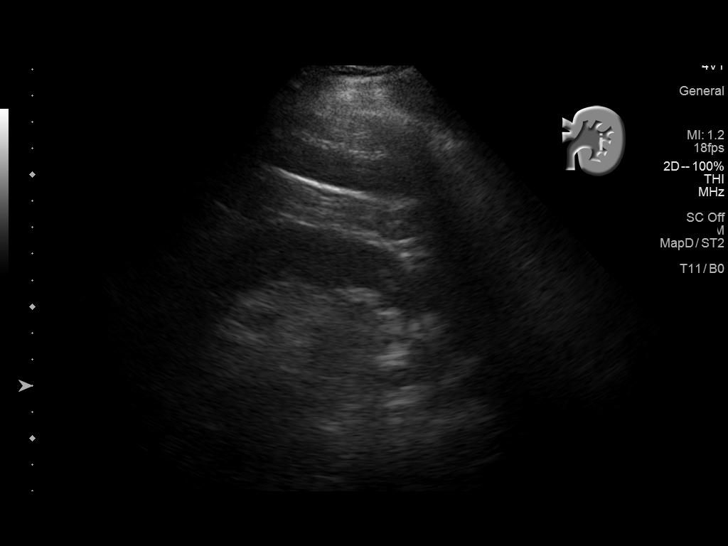
[im 37/50]
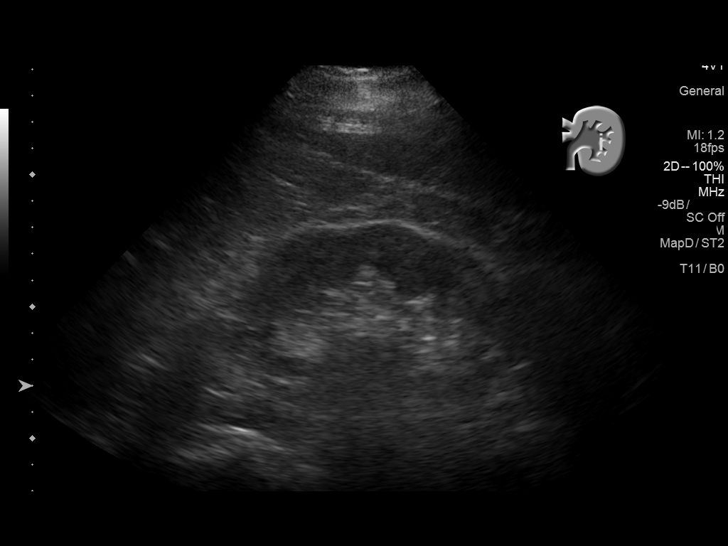
[im 41/50]
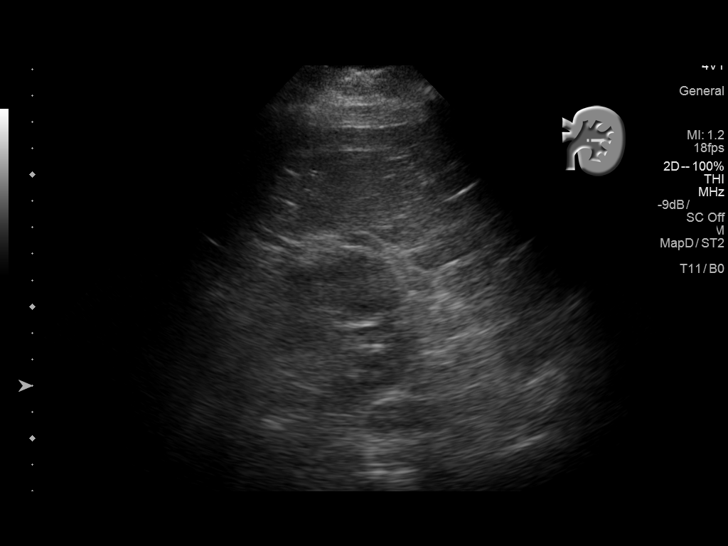
[im 45/50]
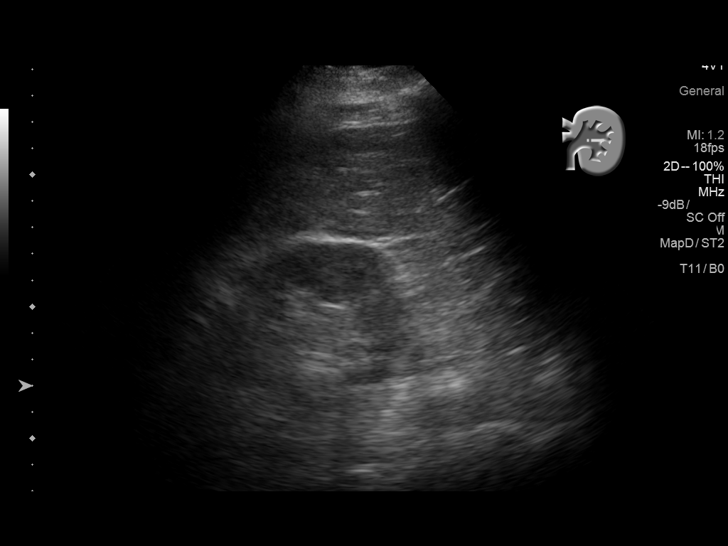
[im 50/50]
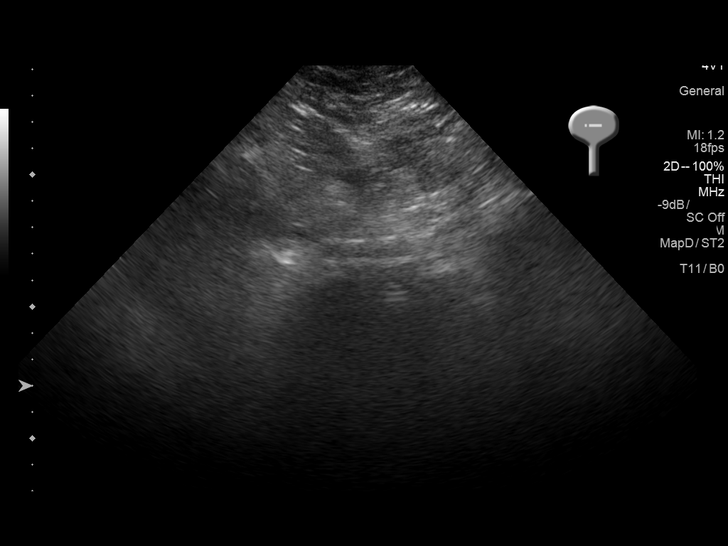

[14 of 25 positions shown; findings below may reference images not displayed]

FINDINGS: Right Kidney:

Length: 13.3 cm.. No hydronephrosis is seen. The renal parenchymal
echogenicity is within normal limits. There is and echogenic focus
in the mid upper right kidney of 8 mm in diameter consistent with
nonobstructing calculus.

Left Kidney:

Length: 12.6 cm.. No hydronephrosis is seen. The parenchyma is
normal in echogenicity. No renal calculi are noted.

Bladder:

The urinary bladder is not distended.

Incidentally the echogenicity of the liver appears somewhat
inhomogeneous and echogenic which may indicate hepatic steatosis.
IMPRESSION: 1. No hydronephrosis.
2. 8 mm nonobstructing right mid upper renal calculus.
3. Incidental inhomogeneous echogenic liver parenchyma suggesting
hepatic steatosis.

## 2019-10-16 DIAGNOSIS — N471 Phimosis: Secondary | ICD-10-CM | POA: Insufficient documentation

## 2020-07-30 ENCOUNTER — Other Ambulatory Visit: Payer: Self-pay

## 2020-07-30 ENCOUNTER — Emergency Department (HOSPITAL_COMMUNITY)
Admission: EM | Admit: 2020-07-30 | Discharge: 2020-07-30 | Disposition: A | Discharge status: Home / Self Care | Payer: PRIVATE HEALTH INSURANCE | Attending: Emergency Medicine | Admitting: Emergency Medicine

## 2020-07-30 ENCOUNTER — Encounter (HOSPITAL_COMMUNITY): Payer: Self-pay

## 2020-07-30 DIAGNOSIS — Z79899 Other long term (current) drug therapy: Secondary | ICD-10-CM | POA: Insufficient documentation

## 2020-07-30 DIAGNOSIS — M545 Low back pain, unspecified: Secondary | ICD-10-CM | POA: Insufficient documentation

## 2020-07-30 DIAGNOSIS — Z7982 Long term (current) use of aspirin: Secondary | ICD-10-CM | POA: Diagnosis not present

## 2020-07-30 DIAGNOSIS — N189 Chronic kidney disease, unspecified: Secondary | ICD-10-CM | POA: Insufficient documentation

## 2020-07-30 DIAGNOSIS — I129 Hypertensive chronic kidney disease with stage 1 through stage 4 chronic kidney disease, or unspecified chronic kidney disease: Secondary | ICD-10-CM | POA: Insufficient documentation

## 2020-07-30 DIAGNOSIS — M549 Dorsalgia, unspecified: Secondary | ICD-10-CM | POA: Diagnosis present

## 2020-07-30 DIAGNOSIS — R109 Unspecified abdominal pain: Secondary | ICD-10-CM | POA: Diagnosis not present

## 2020-07-30 MED ORDER — METHOCARBAMOL 500 MG PO TABS: 500.0000 mg | ORAL_TABLET | Freq: Two times a day (BID) | ORAL | 0 refills | Status: DC | PRN

## 2020-07-30 MED ORDER — NAPROXEN 500 MG PO TABS: 500.0000 mg | ORAL_TABLET | Freq: Two times a day (BID) | ORAL | 0 refills | Status: DC

## 2020-07-30 MED ORDER — PREDNISONE 20 MG PO TABS: 40.0000 mg | ORAL_TABLET | Freq: Every day | ORAL | 0 refills | Status: DC

## 2020-07-30 MED ORDER — KETOROLAC TROMETHAMINE 60 MG/2ML IM SOLN
60.0000 mg | Freq: Once | INTRAMUSCULAR | Status: AC
  Administered 2020-07-30: 60 mg via INTRAMUSCULAR
  Filled 2020-07-30: qty 2

## 2020-07-30 MED ORDER — DEXAMETHASONE SODIUM PHOSPHATE 10 MG/ML IJ SOLN
10.0000 mg | Freq: Once | INTRAMUSCULAR | Status: AC
Start: 2020-07-30 — End: 2020-07-30
  Administered 2020-07-30: 10 mg via INTRAMUSCULAR
  Filled 2020-07-30: qty 1

## 2020-07-30 NOTE — ED Provider Notes (Signed)
[facility] EMERGENCY DEPARTMENT Provider Note   CSN: [id number] Arrival date & time: 07/30/20  2124     History Chief Complaint  Patient presents with  . Flank Pain    Right side    Manuel [name] is a 65 y.o. male.  HPI   This patient is a 65 year old male, history of chronic kidney disease, history of a prior kidney stone, states he had back pain this morning that started when he woke up, it has been intermittent throughout the day and occurs when he tries to change position.  When he tries to get to a sitting or standing position it hurts then once he gets into the standing position he gets better.  No nausea or vomiting, no sweating, no hematuria, no fevers or chills, no medications prior to arrival, symptoms are intermittent and severe at worst.  Past Medical History:  Diagnosis Date  . CKD (chronic kidney disease) 01/08/2017   patient states he does not have.  . Essential hypertension, benign 01/08/2017  . Gout   . High cholesterol 01/08/2017  . Hypertension   . Obesity     Patient Active Problem List   Diagnosis Date Noted  . 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 Family hx colonic polyps 01/09/2017  . Essential hypertension, benign 01/08/2017  . High cholesterol 01/08/2017  . CKD (chronic kidney disease) 01/08/2017    Past Surgical History:  Procedure Laterality Date  . COLONOSCOPY N/A 01/14/2017   Procedure: COLONOSCOPY;  Surgeon: Rogene Houston, MD;  Location: [facility];  Service: Endoscopy;  Laterality: N/A;  . DENTAL SURGERY     teeth removed       Family History  Problem Relation Age of Onset  . Colon cancer Brother     Social History   Tobacco Use  . Smoking status: Never Smoker  . Smokeless tobacco: Never Used  Vaping Use  . Vaping Use: Never used  Substance Use Topics  . Alcohol use: Yes    Comment: a couple of beers a day  . Drug use: No    Home Medications Prior to Admission medications   Medication Sig Start Date End Date  Taking? Authorizing Provider  methocarbamol (ROBAXIN) 500 MG tablet Take 1 tablet (500 mg total) by mouth 2 (two) times daily as needed for muscle spasms. 07/30/20  Yes Noemi Chapel, MD  naproxen (NAPROSYN) 500 MG tablet Take 1 tablet (500 mg total) by mouth 2 (two) times daily with a meal. 07/30/20  Yes Noemi Chapel, MD  predniSONE (DELTASONE) 20 MG tablet Take 2 tablets (40 mg total) by mouth daily. 07/30/20  Yes Noemi Chapel, MD  allopurinol (ZYLOPRIM) 300 MG tablet Take 300 mg by mouth daily.    [provider]  aspirin (ASPIRIN EC) 81 MG EC tablet Take 1 tablet (81 mg total) by mouth daily. Swallow whole. 01/15/17   Rogene Houston, MD  aspirin EC 325 MG tablet Take 325-650 mg by mouth every 8 (eight) hours as needed (for pain.).    [provider]  atenolol (TENORMIN) 25 MG tablet Take 25 mg by mouth daily.    [provider]  CINNAMON PO Take 1,000 mg by mouth 2 (two) times daily.    [provider]  indomethacin (INDOCIN) 50 MG capsule Take 50 mg by mouth 3 (three) times daily as needed (FOR GOUT FLARES UP).     [provider]  losartan (COZAAR) 100 MG tablet Take 100 mg by mouth daily.  [provider]  valsartan-hydrochlorothiazide (DIOVAN-HCT) 320-12.5 MG per tablet Take 1 tablet by mouth daily.    [provider]    Allergies    Patient has no known allergies.  Review of Systems   Review of Systems  All other systems reviewed and are negative.   Physical Exam Updated Vital Signs BP (!) 158/79 (BP Location: Right Arm)   Pulse 74   Temp 98.3 F (36.8 C) (Oral)   Resp 16   Ht 1.88 m (6\' 2" )   Wt 128.4 kg   SpO2 97%   BMI 36.34 kg/m   Physical Exam Vitals and nursing note reviewed.  Constitutional:      General: He is not in acute distress.    Appearance: He is well-developed.  HENT:     Head: Normocephalic and atraumatic.     Mouth/Throat:     Pharynx: No oropharyngeal exudate.  Eyes:     General: No  scleral icterus.       Right eye: No discharge.        Left eye: No discharge.     Conjunctiva/sclera: Conjunctivae normal.     Pupils: Pupils are equal, round, and reactive to light.  Neck:     Thyroid: No thyromegaly.     Vascular: No JVD.  Cardiovascular:     Rate and Rhythm: Normal rate and regular rhythm.     Heart sounds: Normal heart sounds. No murmur heard. No friction rub. No gallop.   Pulmonary:     Effort: Pulmonary effort is normal. No respiratory distress.     Breath sounds: Normal breath sounds. No wheezing or rales.  Abdominal:     General: Bowel sounds are normal. There is no distension.     Palpations: Abdomen is soft. There is no mass.     Tenderness: There is no abdominal tenderness.  Musculoskeletal:        General: No tenderness. Normal range of motion.     Cervical back: Normal range of motion and neck supple.     Comments: Tenderness over the right lower back, no tenderness over the spinal processes  Lymphadenopathy:     Cervical: No cervical adenopathy.  Skin:    General: Skin is warm and dry.     Findings: No erythema or rash.  Neurological:     Mental Status: He is alert.     Coordination: Coordination normal.     Comments: The patient is totally ambulatory without any difficulties but getting from a seated to a standing position causes significant pain.  Once he is standing he has a normal neurologic exam, at rest in the bed he has a normal neurologic exam, he has no CVA tenderness  Psychiatric:        Behavior: Behavior normal.     ED Results / Procedures / Treatments   Labs (all labs ordered are listed, but only abnormal results are displayed) Labs Reviewed - No data to display  EKG None  Radiology No results found.  Procedures Procedures   Medications Ordered in ED Medications  dexamethasone (DECADRON) injection 10 mg (10 mg Intramuscular Given 07/30/20 2230)  ketorolac (TORADOL) injection 60 mg (60 mg Intramuscular Given 07/30/20 2228)     ED Course  I have reviewed the triage vital signs and the nursing notes.  Pertinent labs & imaging results that were available during my care of the patient were reviewed by me and considered in my medical decision making (see chart for details).  MDM Rules/Calculators/A&P                          History and exam consistent more with muscular spasm or strain, less consistent with kidney stone.  Medicines given  Patient well-appearing, vital signs with minimal hypertension, no signs of pathologic red flags, stable for discharge  Final Clinical Impression(s) / ED Diagnoses Final diagnoses:  Acute low back pain without sciatica, unspecified back pain laterality    Rx / DC Orders ED Discharge Orders         Ordered    methocarbamol (ROBAXIN) 500 MG tablet  2 times daily PRN        07/30/20 2237    naproxen (NAPROSYN) 500 MG tablet  2 times daily with meals        07/30/20 2237    predniSONE (DELTASONE) 20 MG tablet  Daily        07/30/20 2237           Noemi Chapel, MD 07/30/20 2237

## 2020-07-30 NOTE — ED Triage Notes (Signed)
Pt arrives via POV c/o right flank pain. Pt reports Hx of renal stones before and reports it to feeling similar to that. Pt denies injury or Hx of back problems.

## 2020-07-30 NOTE — Discharge Instructions (Signed)
Robaxin twice a day as needed as a muscle relaxer, naproxen twice a day for pain, prednisone daily for 5 days, this will make your blood sugar go up.  If you are not getting any better see your doctor in 3 days but if you are getting worse please come back to the ER immediately

## 2021-04-03 ENCOUNTER — Ambulatory Visit: Payer: PRIVATE HEALTH INSURANCE | Admitting: Urology

## 2021-07-04 ENCOUNTER — Ambulatory Visit
Admission: EM | Admit: 2021-07-04 | Discharge: 2021-07-04 | Disposition: A | Discharge status: Home / Self Care | Payer: PRIVATE HEALTH INSURANCE | Attending: Family Medicine | Admitting: Family Medicine

## 2021-07-04 DIAGNOSIS — R319 Hematuria, unspecified: Secondary | ICD-10-CM | POA: Insufficient documentation

## 2021-07-04 DIAGNOSIS — Z87442 Personal history of urinary calculi: Secondary | ICD-10-CM | POA: Insufficient documentation

## 2021-07-04 DIAGNOSIS — N390 Urinary tract infection, site not specified: Secondary | ICD-10-CM | POA: Insufficient documentation

## 2021-07-04 DIAGNOSIS — R739 Hyperglycemia, unspecified: Secondary | ICD-10-CM | POA: Insufficient documentation

## 2021-07-04 DIAGNOSIS — E1165 Type 2 diabetes mellitus with hyperglycemia: Secondary | ICD-10-CM

## 2021-07-04 HISTORY — DX: Type 2 diabetes mellitus without complications: E11.9

## 2021-07-04 LAB — POCT URINALYSIS DIP (MANUAL ENTRY)
Bilirubin, UA: NEGATIVE
Glucose, UA: 500 mg/dL — AB
Nitrite, UA: NEGATIVE
Spec Grav, UA: 1.02 (ref 1.010–1.025)
Urobilinogen, UA: 1 E.U./dL
pH, UA: 5.5 (ref 5.0–8.0)

## 2021-07-04 LAB — POCT FASTING CBG KUC MANUAL ENTRY: POCT Glucose (KUC): 339 mg/dL — AB (ref 70–99)

## 2021-07-04 MED ORDER — METFORMIN HCL 500 MG PO TABS: 500.0000 mg | ORAL_TABLET | Freq: Two times a day (BID) | ORAL | 0 refills | Status: AC

## 2021-07-04 MED ORDER — TAMSULOSIN HCL 0.4 MG PO CAPS: 0.4000 mg | ORAL_CAPSULE | Freq: Every day | ORAL | 0 refills | Status: DC

## 2021-07-04 MED ORDER — CIPROFLOXACIN HCL 500 MG PO TABS: 500.0000 mg | ORAL_TABLET | Freq: Two times a day (BID) | ORAL | 0 refills | Status: DC

## 2021-07-04 NOTE — Discharge Instructions (Addendum)
With your primary care provider in the next 1 to 2 days for a recheck of your symptoms.  If significantly worsening go to the emergency department for further evaluation ?

## 2021-07-04 NOTE — ED Triage Notes (Signed)
Pt states he started having nausea, vomiting and abdominal pain since last night ? ?Pt states he has had a kidney stone lodged for a time but they didn't want to remove it until it started moving again ? ?Denies Meds  ? ? ?

## 2021-07-04 NOTE — ED Provider Notes (Signed)
?[facility] ? ? ? ?CSN: [id number] ?Arrival date & time: 07/04/21  1248 ? ? ?  ? ?History   ?Chief Complaint ?Chief Complaint  ?Patient presents with  ? Abdominal Pain  ?  Lower abdominal pain with nausea and vomiting  ? ? ?HPI ?Manuel [name] is a 66 y.o. male.  ? ?Presenting today with nausea, vomiting, suprapubic abdominal pain, urinary urgency since last night.  He states the pain severity comes and goes but was severe overnight.  He has a known kidney stone that has been lodged for quite some time but they have just been monitoring it up to this point.  Has not tried any medications so far for symptoms. ? ? ?Past Medical History:  ?Diagnosis Date  ? CKD (chronic kidney disease) 01/08/2017  ? patient states he does not have.  ? Diabetes mellitus without complication ([place])   ? Essential hypertension, benign 01/08/2017  ? Gout   ? High cholesterol 01/08/2017  ? Hypertension   ? Obesity   ? ? ?Patient Active Problem List  ? Diagnosis Date Noted  ? Guaiac positive stools 01/09/2017  ? Family hx colonic polyps 01/09/2017  ? Essential hypertension, benign 01/08/2017  ? High cholesterol 01/08/2017  ? CKD (chronic kidney disease) 01/08/2017  ? ? ?Past Surgical History:  ?Procedure Laterality Date  ? COLONOSCOPY N/A 01/14/2017  ? Procedure: COLONOSCOPY;  Surgeon: Rogene Houston, MD;  [facility];  Service: Endoscopy;  Laterality: N/A;  ? DENTAL SURGERY    ? teeth removed  ? ? ? ? ? ?Home Medications   ? ?Prior to Admission medications   ?Medication Sig Start Date End Date Taking? Authorizing Provider  ?ciprofloxacin (CIPRO) 500 MG tablet Take 1 tablet (500 mg total) by mouth 2 (two) times daily. 07/04/21  Yes Volney American, PA-C  ?metFORMIN (GLUCOPHAGE) 500 MG tablet Take 1 tablet (500 mg total) by mouth 2 (two) times daily with a meal. 07/04/21  Yes Volney American, PA-C  ?metFORMIN (GLUCOPHAGE) 500 MG tablet Take by mouth 2 (two) times daily with a meal.   Yes [provider]  ?tamsulosin (FLOMAX) 0.4 MG CAPS capsule Take 1 capsule (0.4 mg total) by mouth daily. 07/04/21  Yes Volney American, PA-C  ?allopurinol (ZYLOPRIM) 300 MG tablet Take 300 mg by mouth daily.    [provider]  ?aspirin (ASPIRIN EC) 81 MG EC tablet Take 1 tablet (81 mg total) by mouth daily. Swallow whole. 01/15/17   Rogene Houston, MD  ?aspirin EC 325 MG tablet Take 325-650 mg by mouth every 8 (eight) hours as needed (for pain.).    [provider]  ?atenolol (TENORMIN) 25 MG tablet Take 25 mg by mouth daily.    [provider]  ?CINNAMON PO Take 1,000 mg by mouth 2 (two) times daily.    [provider]  ?indomethacin (INDOCIN) 50 MG capsule Take 50 mg by mouth 3 (three) times daily as needed (FOR GOUT FLARES UP).     [provider]  ?losartan (COZAAR) 100 MG tablet Take 100 mg by mouth daily.    [provider]  ?methocarbamol (ROBAXIN) 500 MG tablet Take 1 tablet (500 mg total) by mouth 2 (two) times daily as needed for muscle spasms. 07/30/20   Noemi Chapel, MD  ?naproxen (NAPROSYN) 500 MG tablet Take 1 tablet (500 mg total) by mouth 2 (two) times daily with a meal. 07/30/20   Noemi Chapel, MD  ?predniSONE (DELTASONE) 20 MG  tablet Take 2 tablets (40 mg total) by mouth daily. 07/30/20   Noemi Chapel, MD  ?valsartan-hydrochlorothiazide (DIOVAN-HCT) 320-12.5 MG per tablet Take 1 tablet by mouth daily.    [provider]  ? ? ?Family History ?Family History  ?Problem Relation Age of Onset  ? Colon cancer Brother   ? ? ?Social History ?Social History  ? ?Tobacco Use  ? Smoking status: Never  ?  Passive exposure: Never  ? Smokeless tobacco: Never  ?Vaping Use  ? Vaping Use: Never used  ?Substance Use Topics  ? Alcohol use: Yes  ?  Comment: a couple of beers a day  ? Drug use: No  ? ? ? ?Allergies   ?Patient has no known allergies. ? ? ?Review of Systems ?Review of Systems ?Per HPI ? ?Physical Exam ?Triage Vital Signs ?ED Triage Vitals   ?Enc Vitals Group  ?   BP 07/04/21 1306 (!) 166/79  ?   Pulse Rate 07/04/21 1306 (!) 103  ?   Resp 07/04/21 1306 20  ?   Temp 07/04/21 1306 99.9 ?F (37.7 ?C)  ?   Temp Source 07/04/21 1306 Oral  ?   SpO2 07/04/21 1306 95 %  ?   Weight --   ?   Height --   ?   Head Circumference --   ?   Peak Flow --   ?   Pain Score 07/04/21 1308 8  ?   Pain Loc --   ?   Pain Edu? --   ?   Excl. in [place]? --   ? ?No data found. ? ?Updated Vital Signs ?BP (!) 166/79 (BP Location: Right Arm)   Pulse (!) 103   Temp 99.9 ?F (37.7 ?C) (Oral)   Resp 20   SpO2 95%  ? ?Visual Acuity ?Right Eye Distance:   ?Left Eye Distance:   ?Bilateral Distance:   ? ?Right Eye Near:   ?Left Eye Near:    ?Bilateral Near:    ? ?Physical Exam ?Vitals and nursing note reviewed.  ?Constitutional:   ?   Appearance: Normal appearance.  ?HENT:  ?   Head: Atraumatic.  ?   Mouth/Throat:  ?   Mouth: Mucous membranes are moist.  ?Eyes:  ?   Extraocular Movements: Extraocular movements intact.  ?   Conjunctiva/sclera: Conjunctivae normal.  ?Cardiovascular:  ?   Rate and Rhythm: Normal rate and regular rhythm.  ?Pulmonary:  ?   Effort: Pulmonary effort is normal.  ?   Breath sounds: Normal breath sounds. No wheezing or rales.  ?Abdominal:  ?   General: Bowel sounds are normal. There is no distension.  ?   Palpations: Abdomen is soft.  ?   Tenderness: There is abdominal tenderness. There is no right CVA tenderness, left CVA tenderness or guarding.  ?   Comments: Suprapubic abdominal tenderness to palpation without distention or guarding  ?Musculoskeletal:     ?   General: Normal range of motion.  ?   Cervical back: Normal range of motion and neck supple.  ?Skin: ?   General: Skin is warm and dry.  ?Neurological:  ?   General: No focal deficit present.  ?   Mental Status: He is oriented to person, place, and time.  ?   Motor: No weakness.  ?   Gait: Gait normal.  ?Psychiatric:     ?   Mood and Affect: Mood normal.     ?   Thought Content: Thought content normal.     ?  Judgment: Judgment normal.  ? ? ? ?UC Treatments / Results  ?Labs ?(all labs ordered are listed, but only abnormal results are displayed) ?Labs Reviewed  ?POCT URINALYSIS DIP (MANUAL ENTRY) - Abnormal; Notable for the following components:  ?    Result Value  ? Color, UA straw (*)   ? Glucose, UA =500 (*)   ? Ketones, POC UA trace (5) (*)   ? Blood, UA small (*)   ? Protein Ur, POC trace (*)   ? Leukocytes, UA Large (3+) (*)   ? All other components within normal limits  ?POCT FASTING [facility]G [facility] MANUAL ENTRY - Abnormal; Notable for the following components:  ? POCT Glucose ([facility]) 339 (*)   ? All other components within normal limits  ?URINE CULTURE  ? ? ?EKG ? ? ?Radiology ?No results found. ? ?Procedures ?Procedures (including critical care time) ? ?Medications Ordered in UC ?Medications - No data to display ? ?Initial Impression / Assessment and Plan / UC Course  ?I have reviewed the triage vital signs and the nursing notes. ? ?Pertinent labs & imaging results that were available during my care of the patient were reviewed by me and considered in my medical decision making (see chart for details). ? ?  ? ?Mildly tachycardic and hypertensive, otherwise vital signs reassuring.  UA positive for hemoglobin, suspicious for UTI.  Urine culture pending.  Will place on Flomax for suspected persistent symptoms from kidney stone as well as Cipro while awaiting urine culture for UTI.  He was also noted to have glucose urea so fingerstick glucose was performed.  Prior to this he had not mentioned that he had a history of diabetes and was on metformin 500 mg twice daily.  His point-of-care glucose was 339 with last meal last night around 9 PM.  Sent in metformin but once discussing the results with patient he endorses that he already takes metformin for diabetes at which time this was all added to his chart.  Discussed close PCP follow-up in the next day or so to recheck his sugars and his symptoms.  Close home monitoring also  recommended and go to the emergency department if sugars continue to elevate or symptoms worsen. ? ?Final Clinical Impressions(s) / UC Diagnoses  ? ?Final diagnoses:  ?Acute lower UTI  ?Hematuria, unspecified type

## 2021-07-08 LAB — URINE CULTURE: Culture: >=100000 — AB

## 2021-10-19 ENCOUNTER — Other Ambulatory Visit: Payer: Self-pay | Admitting: Family Medicine

## 2021-10-22 NOTE — Telephone Encounter (Signed)
Requested Prescriptions  Pending Prescriptions Disp Refills  . metFORMIN (GLUCOPHAGE) 500 MG tablet [Pharmacy Med Name: metFORMIN HCl 500 MG Oral Tablet] 60 tablet 0    Sig: TAKE 1 TABLET BY MOUTH TWICE DAILY WITH A MEAL     There is no refill protocol information for this order

## 2021-10-30 ENCOUNTER — Other Ambulatory Visit: Payer: Self-pay | Admitting: Family Medicine

## 2021-10-31 NOTE — Telephone Encounter (Signed)
Requested Prescriptions  Pending Prescriptions Disp Refills  . metFORMIN (GLUCOPHAGE) 500 MG tablet [Pharmacy Med Name: metFORMIN HCl 500 MG Oral Tablet] 60 tablet 0    Sig: TAKE 1 TABLET BY MOUTH TWICE DAILY WITH A MEAL     There is no refill protocol information for this order

## 2021-11-16 ENCOUNTER — Ambulatory Visit (INDEPENDENT_AMBULATORY_CARE_PROVIDER_SITE_OTHER): Payer: PRIVATE HEALTH INSURANCE | Admitting: Urology

## 2021-11-16 ENCOUNTER — Encounter: Payer: Self-pay | Admitting: Urology

## 2021-11-16 VITALS — BP 103/64 | HR 99

## 2021-11-16 DIAGNOSIS — R350 Frequency of micturition: Secondary | ICD-10-CM

## 2021-11-16 DIAGNOSIS — N200 Calculus of kidney: Secondary | ICD-10-CM | POA: Diagnosis not present

## 2021-11-16 DIAGNOSIS — N3021 Other chronic cystitis with hematuria: Secondary | ICD-10-CM

## 2021-11-16 LAB — URINALYSIS, ROUTINE W REFLEX MICROSCOPIC
Bilirubin, UA: NEGATIVE
Glucose, UA: NEGATIVE
Ketones, UA: NEGATIVE
Nitrite, UA: NEGATIVE
Protein,UA: NEGATIVE
RBC, UA: NEGATIVE
Specific Gravity, UA: 1.01 (ref 1.005–1.030)
Urobilinogen, Ur: 0.2 mg/dL (ref 0.2–1.0)
pH, UA: 5 (ref 5.0–7.5)

## 2021-11-16 LAB — MICROSCOPIC EXAMINATION
RBC, Urine: NONE SEEN /hpf (ref 0–2)
Renal Epithel, UA: NONE SEEN /hpf

## 2021-11-16 MED ORDER — SULFAMETHOXAZOLE-TRIMETHOPRIM 800-160 MG PO TABS
1.0000 | ORAL_TABLET | Freq: Two times a day (BID) | ORAL | 0 refills | Status: DC
Start: 2021-11-16 — End: 2021-11-20

## 2021-11-16 MED ORDER — TAMSULOSIN HCL 0.4 MG PO CAPS: 0.4000 mg | ORAL_CAPSULE | Freq: Every day | ORAL | 11 refills | Status: DC

## 2021-11-16 NOTE — Progress Notes (Unsigned)
post void residual=5 

## 2021-11-16 NOTE — Patient Instructions (Signed)

## 2021-11-16 NOTE — Progress Notes (Unsigned)
11/16/2021 12:38 PM   Minda Meo [birth year] [id number]  Referring provider: Monico Blitz, MD [address] [place],  [place] [address]  URINARY FREQUENCY   HPI: Mr Follett is a 66yo here for evaluation of urinary frequency, concern for UTI. He was previously on flomax 0.'4mg'$  with his history of nephrolithiasis but is currently not taking flomax 0.'4mg'$  daily. IPSS 28 QOL 5. He has urinary frequency every 30 minutes. He has a weak urinary stream, dysuria, urinary urgency and urinary hesitancy. UA is concerning for infection. He has a history of nephrolithiasis with his last imaging study in 2019. No recent stone events. He denies any flank pain.    PMH: Past Medical History:  Diagnosis Date   CKD (chronic kidney disease) 01/08/2017   patient states he does not have.   Diabetes mellitus without complication ([place])    Essential hypertension, benign 01/08/2017   Gout    High cholesterol 01/08/2017   Hypertension    Obesity     Surgical History: Past Surgical History:  Procedure Laterality Date   COLONOSCOPY N/A 01/14/2017   Procedure: COLONOSCOPY;  Surgeon: Rogene Houston, MD;  [facility];  Service: Endoscopy;  Laterality: N/A;   DENTAL SURGERY     teeth removed    Home Medications:  Allergies as of 11/16/2021   No Known Allergies      Medication List        Accurate as of November 16, 2021 12:38 PM. If you have any questions, ask your nurse or doctor.          allopurinol 300 MG tablet Commonly known as: ZYLOPRIM Take 300 mg by mouth daily.   aspirin EC 325 MG tablet Take 325-650 mg by mouth every 8 (eight) hours as needed (for pain.).   aspirin EC 81 MG tablet Generic drug: aspirin EC Take 1 tablet (81 mg total) by mouth daily. Swallow whole.   atenolol 25 MG tablet Commonly known as: TENORMIN Take 25 mg by mouth daily.   CINNAMON PO Take 1,000 mg by mouth 2 (two) times daily.   ciprofloxacin 500 MG tablet Commonly known as: CIPRO Take 1  tablet (500 mg total) by mouth 2 (two) times daily.   indomethacin 50 MG capsule Commonly known as: INDOCIN Take 50 mg by mouth 3 (three) times daily as needed (FOR GOUT FLARES UP).   losartan 100 MG tablet Commonly known as: COZAAR Take 100 mg by mouth daily.   metFORMIN 500 MG tablet Commonly known as: GLUCOPHAGE Take by mouth 2 (two) times daily with a meal.   metFORMIN 500 MG tablet Commonly known as: GLUCOPHAGE Take 1 tablet (500 mg total) by mouth 2 (two) times daily with a meal.   methocarbamol 500 MG tablet Commonly known as: ROBAXIN Take 1 tablet (500 mg total) by mouth 2 (two) times daily as needed for muscle spasms.   naproxen 500 MG tablet Commonly known as: Naprosyn Take 1 tablet (500 mg total) by mouth 2 (two) times daily with a meal.   predniSONE 20 MG tablet Commonly known as: DELTASONE Take 2 tablets (40 mg total) by mouth daily.   tamsulosin 0.4 MG Caps capsule Commonly known as: Flomax Take 1 capsule (0.4 mg total) by mouth daily.   valsartan-hydrochlorothiazide 320-12.5 MG tablet Commonly known as: DIOVAN-HCT Take 1 tablet by mouth daily.        Allergies: No Known Allergies  Family History: Family History  Problem Relation Age of Onset   Colon cancer Brother  Social History:  reports that he has never smoked. He has never been exposed to tobacco smoke. He has never used smokeless tobacco. He reports current alcohol use. He reports that he does not use drugs.  ROS: All other review of systems were reviewed and are negative except what is noted above in HPI  Physical Exam: BP 103/64   Pulse 99   Constitutional:  Alert and oriented, No acute distress. HEENT: [place] AT, moist mucus membranes.  Trachea midline, no masses. Cardiovascular: No clubbing, cyanosis, or edema. Respiratory: Normal respiratory effort, no increased work of breathing. GI: Abdomen is soft, nontender, nondistended, no abdominal masses GU: No CVA tenderness.  Lymph: No  cervical or inguinal lymphadenopathy. Skin: No rashes, bruises or suspicious lesions. Neurologic: Grossly intact, no focal deficits, moving all 4 extremities. Psychiatric: Normal mood and affect.  Laboratory Data: Lab Results  Component Value Date   WBC 9.6 01/08/2017   HGB 15.8 01/08/2017   HCT 46.8 01/08/2017   MCV 89.7 01/08/2017   PLT 173 01/08/2017    Lab Results  Component Value Date   CREATININE 1.07 10/05/2014    No results found for: "PSA"  No results found for: "TESTOSTERONE"  No results found for: "HGBA1C"  Urinalysis    Component Value Date/Time   COLORURINE YELLOW 10/05/2014 [place] 10/05/2014 1607   LABSPEC 1.021 10/05/2014 1607   PHURINE 5.0 10/05/2014 1607   GLUCOSEU NEGATIVE 10/05/2014 1607   HGBUR NEGATIVE 10/05/2014 1607   BILIRUBINUR negative 07/04/2021 1336   KETONESUR trace (5) (A) 07/04/2021 1336   KETONESUR NEGATIVE 10/05/2014 1607   PROTEINUR trace (A) 07/04/2021 1336   PROTEINUR NEGATIVE 10/05/2014 1607   UROBILINOGEN 1.0 07/04/2021 1336   UROBILINOGEN 0.2 10/05/2014 1607   NITRITE Negative 07/04/2021 1336   NITRITE NEGATIVE 10/05/2014 1607   LEUKOCYTESUR Large (3+) (A) 07/04/2021 1336    No results found for: "LABMICR", "WBCUA", "RBCUA", "LABEPIT", "MUCUS", "BACTERIA"  Pertinent Imaging:  No results found for this or any previous visit.  No results found for this or any previous visit.  No results found for this or any previous visit.  No results found for this or any previous visit.  Results for orders placed during the hospital encounter of 07/22/17  US RENAL  Narrative CLINICAL DATA:  Chronic kidney disease stage 3, obesity  EXAM: RENAL / URINARY TRACT ULTRASOUND COMPLETE  COMPARISON:  CT abdomen pelvis of 10/05/2014  FINDINGS: Right Kidney:  Length: 13.3 cm. No hydronephrosis is seen. The renal parenchymal echogenicity is within normal limits. There is and echogenic focus in the mid upper  right kidney of 8 mm in diameter consistent with nonobstructing calculus.  Left Kidney:  Length: 12.6 cm. No hydronephrosis is seen. The parenchyma is normal in echogenicity. No renal calculi are noted.  Bladder:  The urinary bladder is not distended.  Incidentally the echogenicity of the liver appears somewhat inhomogeneous and echogenic which may indicate hepatic steatosis.  IMPRESSION: 1. No hydronephrosis. 2. 8 mm nonobstructing right mid upper renal calculus. 3. Incidental inhomogeneous echogenic liver parenchyma suggesting hepatic steatosis.   Electronically Signed By: Ivar Drape M.D. On: 07/22/2017 16:11  No results found for this or any previous visit.  No results found for this or any previous visit.  Results for orders placed during the hospital encounter of 10/05/14  CT RENAL STONE STUDY  Narrative CLINICAL DATA:  Left flank pain.  Renal calculi.  EXAM: CT ABDOMEN AND PELVIS WITHOUT CONTRAST  TECHNIQUE: Multidetector CT  imaging of the abdomen and pelvis was performed following the standard protocol without IV contrast.  COMPARISON:  Multiple exams, including 09/06/2011 and 01/21/2011  FINDINGS: Lower chest:  Unremarkable  Hepatobiliary: Diffuse hepatic steatosis.  Pancreas: Unremarkable  Spleen: Unremarkable  Adrenals/Urinary Tract: 4 mm right mid to lower kidney nonobstructive calculus. No additional calculi. No hydronephrosis or hydroureter. Urinary bladder unremarkable.  Stomach/Bowel: Unremarkable.  Appendix normal.  Vascular/Lymphatic: Aortoiliac atherosclerotic vascular disease.  Reproductive: Unremarkable  Other: No supplemental non-categorized findings.  Musculoskeletal: Bridging spurring of the sacroiliac joints. Bilateral facet arthropathy at L5-S1, no overt lumbar impingement.  IMPRESSION: 1. There is a 4 mm right mid to lower kidney nonobstructive calculus but no other calculi are identified. No hydronephrosis,  hydroureter, or other findings to explain the patient's left flank pain. 2. Diffuse hepatic steatosis. 3.  Aortoiliac atherosclerotic vascular disease. 4. Spurring of the sacroiliac joints and bilateral facet arthropathy at L5-S1.   Electronically Signed By: Van Clines M.D. On: 10/05/2014 16:15   Assessment & Plan:    1. Urinary frequency -restart flomax 0.'4mg'$  daily - Urinalysis, Routine w reflex microscopic - BLADDER SCAN AMB NON-IMAGING  2. Acute cystitis -urine for culture -bactrim DS BID for 7 days  3. Nephrolithiasis -CT stone study   No follow-ups on file.  [name], MD  [facility] [place]

## 2021-11-20 ENCOUNTER — Telehealth: Payer: Self-pay

## 2021-11-20 DIAGNOSIS — N3021 Other chronic cystitis with hematuria: Secondary | ICD-10-CM

## 2021-11-20 LAB — URINE CULTURE

## 2021-11-20 MED ORDER — NITROFURANTOIN MONOHYD MACRO 100 MG PO CAPS: 100.0000 mg | ORAL_CAPSULE | Freq: Two times a day (BID) | ORAL | 0 refills | Status: DC

## 2021-11-20 NOTE — Telephone Encounter (Signed)
Spoke with patient. Patient made aware to stop Bactrim and start Macrobid. Patient voiced understanding.

## 2021-11-20 NOTE — Telephone Encounter (Signed)
Pharmacy called to see if another number is available to reach patient. Pharmacy has same number as office.

## 2021-11-20 NOTE — Telephone Encounter (Signed)
ABT changed due to urine culture.  Unable to reach patient at numbers provided.

## 2021-11-29 ENCOUNTER — Ambulatory Visit (INDEPENDENT_AMBULATORY_CARE_PROVIDER_SITE_OTHER): Payer: PRIVATE HEALTH INSURANCE | Admitting: Physician Assistant

## 2021-11-29 VITALS — BP 91/58 | HR 98

## 2021-11-29 DIAGNOSIS — N3021 Other chronic cystitis with hematuria: Secondary | ICD-10-CM

## 2021-11-29 DIAGNOSIS — N390 Urinary tract infection, site not specified: Secondary | ICD-10-CM

## 2021-11-29 LAB — URINALYSIS, ROUTINE W REFLEX MICROSCOPIC
Bilirubin, UA: NEGATIVE
Glucose, UA: NEGATIVE
Nitrite, UA: NEGATIVE
RBC, UA: NEGATIVE
Specific Gravity, UA: 1.025 (ref 1.005–1.030)
Urobilinogen, Ur: 0.2 mg/dL (ref 0.2–1.0)
pH, UA: 5 (ref 5.0–7.5)

## 2021-11-29 LAB — MICROSCOPIC EXAMINATION
RBC, Urine: NONE SEEN /hpf (ref 0–2)
Renal Epithel, UA: NONE SEEN /hpf

## 2021-11-29 MED ORDER — AMOXICILLIN-POT CLAVULANATE 875-125 MG PO TABS: 1.0000 | ORAL_TABLET | Freq: Two times a day (BID) | ORAL | 0 refills | Status: DC

## 2021-11-29 NOTE — Progress Notes (Signed)
Assessment: 1. Urinary tract infection without hematuria, site unspecified - Urinalysis, Routine w reflex microscopic - Urine Culture  2. Chronic cystitis with hematuria    Plan: Augmentin Rx sent to pharmacy and urine cx ordered. Pt advised to increase fluids and discussed dangers of dehydration with his medical issues and medications. Pt will keep appt for CT and FU as scheduled in 2 weeks. ED precautions discussed.  Chief Complaint: No chief complaint on file.   HPI: Manuel [name] is a 66 y.o. male who presents for evaluation of strong odor to his urine for several weeks. No change since recent tx for UTI. Pt denies dysuria, burning, frequency, hematuria.  Fever, chills, abdominal pain.  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  No other fluid intake.  Urine culture on 11/19/2021 grew 100 K colonies of beta-hemolytic strep, group B and 10-25 K colonies of mixed urogenital flora.  Preculture, the patient was placed on Bactrim DS then switched to Macrobid on 11/20/2021. Pt completed course yesterday.  UA= gross exam reveals deep, dark yellow urine  6-10 WBCs, moderate bacteria, nitrite negative IPSS=15, QOL-6  11/16/21 Manuel [name] is a 66yo here for evaluation of urinary frequency, concern for UTI. He was previously on flomax 0.'4mg'$  with his history of nephrolithiasis but is currently not taking flomax 0.'4mg'$  daily. IPSS 28 QOL 5. He has urinary frequency every 30 minutes. He has a weak urinary stream, dysuria, urinary urgency and urinary hesitancy. UA is concerning for infection. He has a history of nephrolithiasis with his last imaging study in 2019. No recent stone events. He denies any flank pain.   Portions of the above documentation were copied from a prior visit for review purposes only.  Allergies: No Known Allergies  PMH: Past Medical History:  Diagnosis Date   CKD (chronic kidney  disease) 01/08/2017   patient states he does not have.   Diabetes mellitus without complication ([place])    Essential hypertension, benign 01/08/2017   Gout    High cholesterol 01/08/2017   Hypertension    Obesity     PSH: Past Surgical History:  Procedure Laterality Date   COLONOSCOPY N/A 01/14/2017   Procedure: COLONOSCOPY;  Surgeon: Rogene Houston, MD;  Location: [facility];  Service: Endoscopy;  Laterality: N/A;   DENTAL SURGERY     teeth removed    SH: Social History   Tobacco Use   Smoking status: Never    Passive exposure: Never   Smokeless tobacco: Never  Vaping Use   Vaping Use: Never used  Substance Use Topics   Alcohol use: Yes    Comment: a couple of beers a day   Drug use: No    ROS: All other review of systems were reviewed and are negative except what is noted above in HPI  PE: BP (!) 91/58   Pulse 98  GENERAL APPEARANCE: Well appearing, well developed, well nourished, NAD HEENT:  Atraumatic, normocephalic NECK:  Supple. Trachea midline ABDOMEN: Distended, no masses EXTREMITIES:  Moves all extremities well NEUROLOGIC:  Alert and oriented x 3 MENTAL STATUS:  appropriate SKIN:  Warm, dry, and intact   Results: Laboratory Data: Lab Results  Component Value Date   WBC 9.6 01/08/2017   HGB 15.8 01/08/2017   HCT 46.8 01/08/2017   MCV 89.7 01/08/2017   PLT 173 01/08/2017    Lab Results  Component Value Date   CREATININE  1.07 10/05/2014      Urinalysis    Component Value Date/Time   COLORURINE YELLOW 10/05/2014 1607   APPEARANCEUR Clear 11/16/2021 1242   LABSPEC 1.021 10/05/2014 1607   PHURINE 5.0 10/05/2014 1607   GLUCOSEU Negative 11/16/2021 1242   HGBUR NEGATIVE 10/05/2014 1607   BILIRUBINUR Negative 11/16/2021 1242   KETONESUR trace (5) (A) 07/04/2021 1336   KETONESUR NEGATIVE 10/05/2014 1607   PROTEINUR Negative 11/16/2021 1242   PROTEINUR NEGATIVE 10/05/2014 1607   UROBILINOGEN 1.0 07/04/2021 1336   UROBILINOGEN 0.2  10/05/2014 1607   NITRITE Negative 11/16/2021 1242   NITRITE NEGATIVE 10/05/2014 1607   LEUKOCYTESUR 1+ (A) 11/16/2021 1242    Lab Results  Component Value Date   LABMICR See below: 11/16/2021   WBCUA 6-10 (A) 11/16/2021   LABEPIT 0-10 11/16/2021   MUCUS Present 11/16/2021   BACTERIA Moderate (A) 11/16/2021    Pertinent Imaging: No results found for this or any previous visit.  No results found for this or any previous visit.  No results found for this or any previous visit.  No results found for this or any previous visit.  Results for orders placed during the hospital encounter of 07/22/17  US RENAL  Narrative CLINICAL DATA:  Chronic kidney disease stage 3, obesity  EXAM: RENAL / URINARY TRACT ULTRASOUND COMPLETE  COMPARISON:  CT abdomen pelvis of 10/05/2014  FINDINGS: Right Kidney:  Length: 13.3 cm. No hydronephrosis is seen. The renal parenchymal echogenicity is within normal limits. There is and echogenic focus in the mid upper right kidney of 8 mm in diameter consistent with nonobstructing calculus.  Left Kidney:  Length: 12.6 cm. No hydronephrosis is seen. The parenchyma is normal in echogenicity. No renal calculi are noted.  Bladder:  The urinary bladder is not distended.  Incidentally the echogenicity of the liver appears somewhat inhomogeneous and echogenic which may indicate hepatic steatosis.  IMPRESSION: 1. No hydronephrosis. 2. 8 mm nonobstructing right mid upper renal calculus. 3. Incidental inhomogeneous echogenic liver parenchyma suggesting hepatic steatosis.   Electronically Signed By: Ivar Drape M.D. On: 07/22/2017 16:11  No results found for this or any previous visit.  No results found for this or any previous visit.  Results for orders placed during the hospital encounter of 10/05/14  CT RENAL STONE STUDY  Narrative CLINICAL DATA:  Left flank pain.  Renal calculi.  EXAM: CT ABDOMEN AND PELVIS WITHOUT  CONTRAST  TECHNIQUE: Multidetector CT imaging of the abdomen and pelvis was performed following the standard protocol without IV contrast.  COMPARISON:  Multiple exams, including 09/06/2011 and 01/21/2011  FINDINGS: Lower chest:  Unremarkable  Hepatobiliary: Diffuse hepatic steatosis.  Pancreas: Unremarkable  Spleen: Unremarkable  Adrenals/Urinary Tract: 4 mm right mid to lower kidney nonobstructive calculus. No additional calculi. No hydronephrosis or hydroureter. Urinary bladder unremarkable.  Stomach/Bowel: Unremarkable.  Appendix normal.  Vascular/Lymphatic: Aortoiliac atherosclerotic vascular disease.  Reproductive: Unremarkable  Other: No supplemental non-categorized findings.  Musculoskeletal: Bridging spurring of the sacroiliac joints. Bilateral facet arthropathy at L5-S1, no overt lumbar impingement.  IMPRESSION: 1. There is a 4 mm right mid to lower kidney nonobstructive calculus but no other calculi are identified. No hydronephrosis, hydroureter, or other findings to explain the patient's left flank pain. 2. Diffuse hepatic steatosis. 3.  Aortoiliac atherosclerotic vascular disease. 4. Spurring of the sacroiliac joints and bilateral facet arthropathy at L5-S1.   Electronically Signed By: Van Clines M.D. On: 10/05/2014 16:15  No results found for this or any previous visit (from the  past 24 hour(s)).

## 2021-11-30 ENCOUNTER — Ambulatory Visit: Payer: PRIVATE HEALTH INSURANCE | Admitting: Urology

## 2021-12-01 LAB — URINE CULTURE

## 2021-12-03 ENCOUNTER — Telehealth: Payer: Self-pay

## 2021-12-03 NOTE — Telephone Encounter (Signed)
Made patient's wife Joy aware that patient culture shows that Augmentin will cover the bacteria and for patient to complete entire 7 days. Patient's wife Caryl Asp voiced understanding.

## 2021-12-03 NOTE — Telephone Encounter (Signed)
-----   Message from Reynaldo Minium, [place] sent at 12/03/2021  8:49 AM EDT ----- Please let pt know his culture shows Augmentin covers bacteria. Pt needs to complete entire 7 days ----- Message ----- From: Interface, [facility] Lab Results In Sent: 11/29/2021   3:51 PM EDT To: Reynaldo Minium, PA-C

## 2021-12-08 ENCOUNTER — Ambulatory Visit (HOSPITAL_BASED_OUTPATIENT_CLINIC_OR_DEPARTMENT_OTHER)
Admission: RE | Admit: 2021-12-08 | Discharge: 2021-12-08 | Disposition: A | Discharge status: Home / Self Care | Payer: PRIVATE HEALTH INSURANCE | Source: Ambulatory Visit | Attending: Urology | Admitting: Urology

## 2021-12-08 DIAGNOSIS — N200 Calculus of kidney: Secondary | ICD-10-CM | POA: Diagnosis present

## 2021-12-17 ENCOUNTER — Encounter: Payer: Self-pay | Admitting: Urology

## 2021-12-17 ENCOUNTER — Ambulatory Visit (INDEPENDENT_AMBULATORY_CARE_PROVIDER_SITE_OTHER): Payer: PRIVATE HEALTH INSURANCE | Admitting: Urology

## 2021-12-17 ENCOUNTER — Encounter (INDEPENDENT_AMBULATORY_CARE_PROVIDER_SITE_OTHER): Payer: Self-pay | Admitting: *Deleted

## 2021-12-17 VITALS — BP 158/75 | HR 101

## 2021-12-17 DIAGNOSIS — R3915 Urgency of urination: Secondary | ICD-10-CM | POA: Diagnosis not present

## 2021-12-17 DIAGNOSIS — R3914 Feeling of incomplete bladder emptying: Secondary | ICD-10-CM

## 2021-12-17 DIAGNOSIS — R82998 Other abnormal findings in urine: Secondary | ICD-10-CM | POA: Diagnosis not present

## 2021-12-17 DIAGNOSIS — Z87442 Personal history of urinary calculi: Secondary | ICD-10-CM

## 2021-12-17 DIAGNOSIS — R350 Frequency of micturition: Secondary | ICD-10-CM | POA: Diagnosis not present

## 2021-12-17 DIAGNOSIS — N200 Calculus of kidney: Secondary | ICD-10-CM

## 2021-12-17 DIAGNOSIS — N138 Other obstructive and reflux uropathy: Secondary | ICD-10-CM

## 2021-12-17 DIAGNOSIS — R3912 Poor urinary stream: Secondary | ICD-10-CM

## 2021-12-17 DIAGNOSIS — N401 Enlarged prostate with lower urinary tract symptoms: Secondary | ICD-10-CM

## 2021-12-17 DIAGNOSIS — N3289 Other specified disorders of bladder: Secondary | ICD-10-CM

## 2021-12-17 MED ORDER — SILODOSIN 8 MG PO CAPS: 8.0000 mg | ORAL_CAPSULE | Freq: Every day | ORAL | 11 refills | Status: DC

## 2021-12-17 NOTE — Patient Instructions (Signed)

## 2021-12-17 NOTE — Progress Notes (Unsigned)
12/17/2021 9:11 AM   Minda Meo [birth year] [id number]  Referring provider: Monico Blitz, MD [address] [place],  [place] [address]  Followup nephrolithiasis   HPI: Mr Manuel [name] is a 66yo here for followup for nephrolithiasis. CT stone study 12/08/2021 shows no calculi. He continues to have dark urine that is intermittently foul smelling. IPSS 15 QOL 3. He has urinary urgency, starting and stopping, a feeling of incomplete emptying and spraying of his urinary stream.    PMH: Past Medical History:  Diagnosis Date   CKD (chronic kidney disease) 01/08/2017   patient states he does not have.   Diabetes mellitus without complication ([place])    Essential hypertension, benign 01/08/2017   Gout    High cholesterol 01/08/2017   Hypertension    Obesity     Surgical History: Past Surgical History:  Procedure Laterality Date   COLONOSCOPY N/A 01/14/2017   Procedure: COLONOSCOPY;  Surgeon: Rogene Houston, MD;  [facility];  Service: Endoscopy;  Laterality: N/A;   DENTAL SURGERY     teeth removed    Home Medications:  Allergies as of 12/17/2021   No Known Allergies      Medication List        Accurate as of December 17, 2021  9:11 AM. If you have any questions, ask your nurse or doctor.          allopurinol 300 MG tablet Commonly known as: ZYLOPRIM Take 300 mg by mouth daily.   amoxicillin-clavulanate 875-125 MG tablet Commonly known as: AUGMENTIN Take 1 tablet by mouth every 12 (twelve) hours.   aspirin EC 325 MG tablet Take 325-650 mg by mouth every 8 (eight) hours as needed (for pain.).   aspirin EC 81 MG tablet Generic drug: aspirin EC Take 1 tablet (81 mg total) by mouth daily. Swallow whole.   atenolol 25 MG tablet Commonly known as: TENORMIN Take 25 mg by mouth daily.   CINNAMON PO Take 1,000 mg by mouth 2 (two) times daily.   indomethacin 50 MG capsule Commonly known as: INDOCIN Take 50 mg by mouth 3 (three) times daily as needed (FOR  GOUT FLARES UP).   losartan 100 MG tablet Commonly known as: COZAAR Take 100 mg by mouth daily.   metFORMIN 500 MG tablet Commonly known as: GLUCOPHAGE Take by mouth 2 (two) times daily with a meal.   metFORMIN 500 MG tablet Commonly known as: GLUCOPHAGE Take 1 tablet (500 mg total) by mouth 2 (two) times daily with a meal.   methocarbamol 500 MG tablet Commonly known as: ROBAXIN Take 1 tablet (500 mg total) by mouth 2 (two) times daily as needed for muscle spasms.   naproxen 500 MG tablet Commonly known as: Naprosyn Take 1 tablet (500 mg total) by mouth 2 (two) times daily with a meal.   predniSONE 20 MG tablet Commonly known as: DELTASONE Take 2 tablets (40 mg total) by mouth daily.   tamsulosin 0.4 MG Caps capsule Commonly known as: Flomax Take 1 capsule (0.4 mg total) by mouth daily.   valsartan-hydrochlorothiazide 320-12.5 MG tablet Commonly known as: DIOVAN-HCT Take 1 tablet by mouth daily.        Allergies: No Known Allergies  Family History: Family History  Problem Relation Age of Onset   Colon cancer Brother     Social History:  reports that he has never smoked. He has never been exposed to tobacco smoke. He has never used smokeless tobacco. He reports current alcohol use. He reports that  he does not use drugs.  ROS: All other review of systems were reviewed and are negative except what is noted above in HPI  Physical Exam: BP (!) 158/75   Pulse (!) 101   Constitutional:  Alert and oriented, No acute distress. HEENT: [place] AT, moist mucus membranes.  Trachea midline, no masses. Cardiovascular: No clubbing, cyanosis, or edema. Respiratory: Normal respiratory effort, no increased work of breathing. GI: Abdomen is soft, nontender, nondistended, no abdominal masses GU: No CVA tenderness.  Lymph: No cervical or inguinal lymphadenopathy. Skin: No rashes, bruises or suspicious lesions. Neurologic: Grossly intact, no focal deficits, moving all 4  extremities. Psychiatric: Normal mood and affect.  Laboratory Data: Lab Results  Component Value Date   WBC 9.6 01/08/2017   HGB 15.8 01/08/2017   HCT 46.8 01/08/2017   MCV 89.7 01/08/2017   PLT 173 01/08/2017    Lab Results  Component Value Date   CREATININE 1.07 10/05/2014    No results found for: "PSA"  No results found for: "TESTOSTERONE"  No results found for: "HGBA1C"  Urinalysis    Component Value Date/Time   COLORURINE YELLOW 10/05/2014 1607   APPEARANCEUR Clear 11/29/2021 1427   LABSPEC 1.021 10/05/2014 1607   PHURINE 5.0 10/05/2014 1607   GLUCOSEU Negative 11/29/2021 1427   HGBUR NEGATIVE 10/05/2014 1607   BILIRUBINUR Negative 11/29/2021 1427   KETONESUR trace (5) (A) 07/04/2021 1336   KETONESUR NEGATIVE 10/05/2014 1607   PROTEINUR 1+ (A) 11/29/2021 1427   PROTEINUR NEGATIVE 10/05/2014 1607   UROBILINOGEN 1.0 07/04/2021 1336   UROBILINOGEN 0.2 10/05/2014 1607   NITRITE Negative 11/29/2021 1427   NITRITE NEGATIVE 10/05/2014 1607   LEUKOCYTESUR Trace (A) 11/29/2021 1427    Lab Results  Component Value Date   LABMICR See below: 11/29/2021   WBCUA 6-10 (A) 11/29/2021   LABEPIT 0-10 11/29/2021   MUCUS Present 11/29/2021   BACTERIA Moderate (A) 11/29/2021    Pertinent Imaging: CT 12/08/2021: Images reviewed and discussed with the patient  No results found for this or any previous visit.  No results found for this or any previous visit.  No results found for this or any previous visit.  No results found for this or any previous visit.  Results for orders placed during the hospital encounter of 07/22/17  US RENAL  Narrative CLINICAL DATA:  Chronic kidney disease stage 3, obesity  EXAM: RENAL / URINARY TRACT ULTRASOUND COMPLETE  COMPARISON:  CT abdomen pelvis of 10/05/2014  FINDINGS: Right Kidney:  Length: 13.3 cm. No hydronephrosis is seen. The renal parenchymal echogenicity is within normal limits. There is and echogenic focus in the  mid upper right kidney of 8 mm in diameter consistent with nonobstructing calculus.  Left Kidney:  Length: 12.6 cm. No hydronephrosis is seen. The parenchyma is normal in echogenicity. No renal calculi are noted.  Bladder:  The urinary bladder is not distended.  Incidentally the echogenicity of the liver appears somewhat inhomogeneous and echogenic which may indicate hepatic steatosis.  IMPRESSION: 1. No hydronephrosis. 2. 8 mm nonobstructing right mid upper renal calculus. 3. Incidental inhomogeneous echogenic liver parenchyma suggesting hepatic steatosis.   Electronically Signed By: Ivar Drape M.D. On: 07/22/2017 16:11  No valid procedures specified. No results found for this or any previous visit.  Results for orders placed in visit on 11/16/21  CT RENAL STONE STUDY  Narrative CLINICAL DATA:  Nephrolithiasis.  EXAM: CT ABDOMEN AND PELVIS WITHOUT CONTRAST  TECHNIQUE: Multidetector CT imaging of the abdomen and pelvis was performed  following the standard protocol without IV contrast.  RADIATION DOSE REDUCTION: This exam was performed according to the departmental dose-optimization program which includes automated exposure control, adjustment of the mA and/or kV according to patient size and/or use of iterative reconstruction technique.  COMPARISON:  10/05/2014  FINDINGS: Lower chest: No acute findings.  Hepatobiliary: Moderate diffuse hepatic steatosis is again demonstrated. No mass visualized on this unenhanced exam. Gallbladder is unremarkable. No evidence of biliary ductal dilatation.  Pancreas: No mass or inflammatory process visualized on this unenhanced exam.  Spleen:  Within normal limits in size.  Adrenals/Urinary tract: No evidence of urolithiasis or hydronephrosis. Mild diffuse bladder wall thickening, which may be seen with cystitis or chronic bladder outlet obstruction.  Stomach/Bowel: No evidence of obstruction, inflammatory process,  or abnormal fluid collections. Normal appendix visualized.  Vascular/Lymphatic: No pathologically enlarged lymph nodes identified. No evidence of abdominal aortic aneurysm. Aortic atherosclerotic calcification incidentally noted.  Reproductive:  No mass or other significant abnormality.  Other:  None.  Musculoskeletal:  No suspicious bone lesions identified.  IMPRESSION: No evidence of urolithiasis or hydronephrosis.  Mild diffuse bladder wall thickening, which may be seen with cystitis or chronic bladder outlet obstruction.  Stable moderate hepatic steatosis.   Electronically Signed By: Marlaine Hind M.D. On: 12/10/2021 13:47   Assessment & Plan:    1. Nephrolithiasis RTC 1 year with renal US - Urinalysis, Routine w reflex microscopic  2. BPH with urinary frequency -Rapaflo '8mg'$  daily   No follow-ups on file.  [name], MD  [facility] [place]

## 2022-01-03 ENCOUNTER — Other Ambulatory Visit (HOSPITAL_COMMUNITY): Payer: PRIVATE HEALTH INSURANCE

## 2022-01-16 ENCOUNTER — Encounter: Payer: Self-pay | Admitting: Urology

## 2022-01-16 ENCOUNTER — Ambulatory Visit (INDEPENDENT_AMBULATORY_CARE_PROVIDER_SITE_OTHER): Payer: PRIVATE HEALTH INSURANCE | Admitting: Urology

## 2022-01-16 VITALS — BP 178/80 | HR 89 | Ht 74.0 in | Wt 283.0 lb

## 2022-01-16 DIAGNOSIS — N401 Enlarged prostate with lower urinary tract symptoms: Secondary | ICD-10-CM | POA: Diagnosis not present

## 2022-01-16 DIAGNOSIS — R339 Retention of urine, unspecified: Secondary | ICD-10-CM | POA: Diagnosis not present

## 2022-01-16 DIAGNOSIS — R350 Frequency of micturition: Secondary | ICD-10-CM | POA: Diagnosis not present

## 2022-01-16 DIAGNOSIS — N138 Other obstructive and reflux uropathy: Secondary | ICD-10-CM

## 2022-01-16 LAB — BLADDER SCAN AMB NON-IMAGING: Scan Result: 0

## 2022-01-16 MED ORDER — MIRABEGRON ER 25 MG PO TB24: 25.0000 mg | ORAL_TABLET | Freq: Every day | ORAL | 0 refills | Status: DC

## 2022-01-16 NOTE — Progress Notes (Signed)
01/16/2022 2:54 PM   Manuel [name] Apr 14, 1955 [id number]  Referring provider: Monico Blitz, MD [address] [place],  [place] [address]  Spraying urine stream   HPI: Mr Lippman is a 66yo here for followup for BPH and urinary frequency. IPSS 11 QOL 6 on rapaflo '8mg'$ . He has spraying of his urinary stream when he gets a strong urge to urinate. He does not get spraying of his urinary stream with every void.Marland Kitchen He has rare urinary incontinence. Nocturia 0x. Urine stream is a strong. No has urinary frequency every 2-3 hours.    PMH: Past Medical History:  Diagnosis Date   CKD (chronic kidney disease) 01/08/2017   patient states he does not have.   Diabetes mellitus without complication ([place])    Essential hypertension, benign 01/08/2017   Gout    High cholesterol 01/08/2017   Hypertension    Obesity     Surgical History: Past Surgical History:  Procedure Laterality Date   COLONOSCOPY N/A 01/14/2017   Procedure: COLONOSCOPY;  Surgeon: Rogene Houston, MD;  Location: [facility];  Service: Endoscopy;  Laterality: N/A;   DENTAL SURGERY     teeth removed    Home Medications:  Allergies as of 01/16/2022   No Known Allergies      Medication List        Accurate as of January 16, 2022  2:54 PM. If you have any questions, ask your nurse or doctor.          allopurinol 300 MG tablet Commonly known as: ZYLOPRIM Take 300 mg by mouth daily.   amoxicillin-clavulanate 875-125 MG tablet Commonly known as: AUGMENTIN Take 1 tablet by mouth every 12 (twelve) hours.   aspirin EC 325 MG tablet Take 325-650 mg by mouth every 8 (eight) hours as needed (for pain.).   aspirin EC 81 MG tablet Generic drug: aspirin EC Take 81 mg by mouth daily. Swallow whole.   atenolol 25 MG tablet Commonly known as: TENORMIN Take 25 mg by mouth daily.   CINNAMON PO Take 1,000 mg by mouth 2 (two) times daily.   indomethacin 50 MG capsule Commonly known as: INDOCIN Take 50 mg by mouth 3  (three) times daily as needed (FOR GOUT FLARES UP).   losartan 100 MG tablet Commonly known as: COZAAR Take 100 mg by mouth daily.   metFORMIN 500 MG tablet Commonly known as: GLUCOPHAGE Take by mouth 2 (two) times daily with a meal.   metFORMIN 500 MG tablet Commonly known as: GLUCOPHAGE Take 1 tablet (500 mg total) by mouth 2 (two) times daily with a meal.   methocarbamol 500 MG tablet Commonly known as: ROBAXIN Take 1 tablet (500 mg total) by mouth 2 (two) times daily as needed for muscle spasms.   naproxen 500 MG tablet Commonly known as: Naprosyn Take 1 tablet (500 mg total) by mouth 2 (two) times daily with a meal.   predniSONE 20 MG tablet Commonly known as: DELTASONE Take 2 tablets (40 mg total) by mouth daily.   silodosin 8 MG Caps capsule Commonly known as: RAPAFLO Take 1 capsule (8 mg total) by mouth at bedtime.   tamsulosin 0.4 MG Caps capsule Commonly known as: Flomax Take 1 capsule (0.4 mg total) by mouth daily.   valsartan-hydrochlorothiazide 320-12.5 MG tablet Commonly known as: DIOVAN-HCT Take 1 tablet by mouth daily.        Allergies: No Known Allergies  Family History: Family History  Problem Relation Age of Onset   Colon cancer Brother  Social History:  reports that he has never smoked. He has never been exposed to tobacco smoke. He has never used smokeless tobacco. He reports current alcohol use. He reports that he does not use drugs.  ROS: All other review of systems were reviewed and are negative except what is noted above in HPI  Physical Exam: BP (!) 178/80   Pulse 89   Ht '6\' 2"'$  (1.88 m)   Wt 283 lb (128.4 kg)   BMI 36.34 kg/m   Constitutional:  Alert and oriented, No acute distress. HEENT: [place] AT, moist mucus membranes.  Trachea midline, no masses. Cardiovascular: No clubbing, cyanosis, or edema. Respiratory: Normal respiratory effort, no increased work of breathing. GI: Abdomen is soft, nontender, nondistended, no  abdominal masses GU: No CVA tenderness.  Lymph: No cervical or inguinal lymphadenopathy. Skin: No rashes, bruises or suspicious lesions. Neurologic: Grossly intact, no focal deficits, moving all 4 extremities. Psychiatric: Normal mood and affect.  Laboratory Data: Lab Results  Component Value Date   WBC 9.6 01/08/2017   HGB 15.8 01/08/2017   HCT 46.8 01/08/2017   MCV 89.7 01/08/2017   PLT 173 01/08/2017    Lab Results  Component Value Date   CREATININE 1.07 10/05/2014    No results found for: "PSA"  No results found for: "TESTOSTERONE"  No results found for: "HGBA1C"  Urinalysis    Component Value Date/Time   COLORURINE YELLOW 10/05/2014 1607   APPEARANCEUR Clear 11/29/2021 1427   LABSPEC 1.021 10/05/2014 1607   PHURINE 5.0 10/05/2014 1607   GLUCOSEU Negative 11/29/2021 1427   HGBUR NEGATIVE 10/05/2014 1607   BILIRUBINUR Negative 11/29/2021 1427   KETONESUR trace (5) (A) 07/04/2021 1336   KETONESUR NEGATIVE 10/05/2014 1607   PROTEINUR 1+ (A) 11/29/2021 1427   PROTEINUR NEGATIVE 10/05/2014 1607   UROBILINOGEN 1.0 07/04/2021 1336   UROBILINOGEN 0.2 10/05/2014 1607   NITRITE Negative 11/29/2021 1427   NITRITE NEGATIVE 10/05/2014 1607   LEUKOCYTESUR Trace (A) 11/29/2021 1427    Lab Results  Component Value Date   LABMICR See below: 11/29/2021   WBCUA 6-10 (A) 11/29/2021   LABEPIT 0-10 11/29/2021   MUCUS Present 11/29/2021   BACTERIA Moderate (A) 11/29/2021    Pertinent Imaging:  No results found for this or any previous visit.  No results found for this or any previous visit.  No results found for this or any previous visit.  No results found for this or any previous visit.  Results for orders placed during the hospital encounter of 07/22/17  US RENAL  Narrative CLINICAL DATA:  Chronic kidney disease stage 3, obesity  EXAM: RENAL / URINARY TRACT ULTRASOUND COMPLETE  COMPARISON:  CT abdomen pelvis of 10/05/2014  FINDINGS: Right  Kidney:  Length: 13.3 cm. No hydronephrosis is seen. The renal parenchymal echogenicity is within normal limits. There is and echogenic focus in the mid upper right kidney of 8 mm in diameter consistent with nonobstructing calculus.  Left Kidney:  Length: 12.6 cm. No hydronephrosis is seen. The parenchyma is normal in echogenicity. No renal calculi are noted.  Bladder:  The urinary bladder is not distended.  Incidentally the echogenicity of the liver appears somewhat inhomogeneous and echogenic which may indicate hepatic steatosis.  IMPRESSION: 1. No hydronephrosis. 2. 8 mm nonobstructing right mid upper renal calculus. 3. Incidental inhomogeneous echogenic liver parenchyma suggesting hepatic steatosis.   Electronically Signed By: Ivar Drape M.D. On: 07/22/2017 16:11  No valid procedures specified. No results found for this or any previous visit.  Results for orders placed in visit on 11/16/21  CT RENAL STONE STUDY  Narrative CLINICAL DATA:  Nephrolithiasis.  EXAM: CT ABDOMEN AND PELVIS WITHOUT CONTRAST  TECHNIQUE: Multidetector CT imaging of the abdomen and pelvis was performed following the standard protocol without IV contrast.  RADIATION DOSE REDUCTION: This exam was performed according to the departmental dose-optimization program which includes automated exposure control, adjustment of the mA and/or kV according to patient size and/or use of iterative reconstruction technique.  COMPARISON:  10/05/2014  FINDINGS: Lower chest: No acute findings.  Hepatobiliary: Moderate diffuse hepatic steatosis is again demonstrated. No mass visualized on this unenhanced exam. Gallbladder is unremarkable. No evidence of biliary ductal dilatation.  Pancreas: No mass or inflammatory process visualized on this unenhanced exam.  Spleen:  Within normal limits in size.  Adrenals/Urinary tract: No evidence of urolithiasis or hydronephrosis. Mild diffuse bladder wall  thickening, which may be seen with cystitis or chronic bladder outlet obstruction.  Stomach/Bowel: No evidence of obstruction, inflammatory process, or abnormal fluid collections. Normal appendix visualized.  Vascular/Lymphatic: No pathologically enlarged lymph nodes identified. No evidence of abdominal aortic aneurysm. Aortic atherosclerotic calcification incidentally noted.  Reproductive:  No mass or other significant abnormality.  Other:  None.  Musculoskeletal:  No suspicious bone lesions identified.  IMPRESSION: No evidence of urolithiasis or hydronephrosis.  Mild diffuse bladder wall thickening, which may be seen with cystitis or chronic bladder outlet obstruction.  Stable moderate hepatic steatosis.   Electronically Signed By: Marlaine Hind M.D. On: 12/10/2021 13:47   Assessment & Plan:    1. Urine retention -continue rapaflo '8mg'$  daily - Urinalysis, Routine w reflex microscopic - BLADDER SCAN AMB NON-IMAGING  2. BPH  with urinary frequency -mirabegron '25mg'$  daily   No follow-ups on file.  [name], MD  [facility] [place]

## 2022-01-16 NOTE — Patient Instructions (Signed)

## 2022-01-16 NOTE — Progress Notes (Signed)
post void residual =0mL 

## 2022-01-17 LAB — URINALYSIS, ROUTINE W REFLEX MICROSCOPIC
Bilirubin, UA: NEGATIVE
Glucose, UA: NEGATIVE
Ketones, UA: NEGATIVE
Nitrite, UA: NEGATIVE
Protein,UA: NEGATIVE
RBC, UA: NEGATIVE
Specific Gravity, UA: 1.015 (ref 1.005–1.030)
Urobilinogen, Ur: 0.2 mg/dL (ref 0.2–1.0)
pH, UA: 5.5 (ref 5.0–7.5)

## 2022-01-17 LAB — MICROSCOPIC EXAMINATION

## 2022-02-18 ENCOUNTER — Ambulatory Visit (INDEPENDENT_AMBULATORY_CARE_PROVIDER_SITE_OTHER): Payer: PRIVATE HEALTH INSURANCE | Admitting: Urology

## 2022-02-18 VITALS — BP 152/72 | HR 97

## 2022-02-18 DIAGNOSIS — N35912 Unspecified bulbous urethral stricture, male: Secondary | ICD-10-CM | POA: Diagnosis not present

## 2022-02-18 DIAGNOSIS — R350 Frequency of micturition: Secondary | ICD-10-CM

## 2022-02-18 DIAGNOSIS — R339 Retention of urine, unspecified: Secondary | ICD-10-CM

## 2022-02-18 DIAGNOSIS — N401 Enlarged prostate with lower urinary tract symptoms: Secondary | ICD-10-CM

## 2022-02-18 DIAGNOSIS — N138 Other obstructive and reflux uropathy: Secondary | ICD-10-CM

## 2022-02-18 DIAGNOSIS — R39198 Other difficulties with micturition: Secondary | ICD-10-CM

## 2022-02-18 NOTE — Progress Notes (Signed)
   02/18/22  CC: difficulty urinating   HPI:  Blood pressure (!) 152/72, pulse 97. NED. A&Ox3.   No respiratory distress   Abd soft, NT, ND Normal phallus with bilateral descended testicles  Cystoscopy Procedure Note  Patient identification was confirmed, informed consent was obtained, and patient was prepped using Betadine solution.  Lidocaine jelly was administered per urethral meatus.     Pre-Procedure: - Inspection reveals a normal caliber ureteral meatus.  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   Retroflexion shows    Post-Procedure: - Patient tolerated the procedure well  Assessment/ Plan: We discussed the management of urethral stricture including balloon dilation, DVIU, and urethroplasty. After discussing the options the patient elects fro balloon dilation.    No follow-ups on file.  [name], MD

## 2022-02-19 LAB — URINALYSIS, ROUTINE W REFLEX MICROSCOPIC
Bilirubin, UA: NEGATIVE
Ketones, UA: NEGATIVE
Nitrite, UA: NEGATIVE
RBC, UA: NEGATIVE
Specific Gravity, UA: 1.03 (ref 1.005–1.030)
Urobilinogen, Ur: 1 mg/dL (ref 0.2–1.0)
pH, UA: 5.5 (ref 5.0–7.5)

## 2022-02-19 LAB — MICROSCOPIC EXAMINATION: RBC, Urine: NONE SEEN /hpf (ref 0–2)

## 2022-02-26 ENCOUNTER — Telehealth: Payer: Self-pay

## 2022-02-26 ENCOUNTER — Encounter: Payer: Self-pay | Admitting: Urology

## 2022-02-26 NOTE — Patient Instructions (Signed)

## 2022-02-26 NOTE — Telephone Encounter (Signed)
I spoke with Manuel [name]. We have discussed possible surgery dates and 03/28/2021 was agreed upon by all parties. Patient given information about surgery date, what to expect pre-operatively and post operatively. Patient wife would like to wait until after the holidays due to have post op catheter in place   We discussed that a pre-op nurse will be calling to set up the pre-op visit that will take place prior to surgery. Informed patient that our office will communicate any additional care to be provided after surgery.    Patients questions or concerns were discussed during our call. Advised to call our office should there be any additional information, questions or concerns that arise. Patient verbalized understanding.

## 2022-03-04 ENCOUNTER — Emergency Department (HOSPITAL_COMMUNITY)
Admission: EM | Admit: 2022-03-04 | Discharge: 2022-03-04 | Disposition: A | Discharge status: Home / Self Care | Payer: PRIVATE HEALTH INSURANCE | Attending: Emergency Medicine | Admitting: Emergency Medicine

## 2022-03-04 ENCOUNTER — Other Ambulatory Visit: Payer: Self-pay

## 2022-03-04 ENCOUNTER — Encounter (HOSPITAL_COMMUNITY): Payer: Self-pay | Admitting: Emergency Medicine

## 2022-03-04 ENCOUNTER — Emergency Department (HOSPITAL_COMMUNITY): Payer: PRIVATE HEALTH INSURANCE

## 2022-03-04 DIAGNOSIS — Z1152 Encounter for screening for COVID-19: Secondary | ICD-10-CM | POA: Diagnosis not present

## 2022-03-04 DIAGNOSIS — S20212A Contusion of left front wall of thorax, initial encounter: Secondary | ICD-10-CM | POA: Diagnosis not present

## 2022-03-04 DIAGNOSIS — R0781 Pleurodynia: Secondary | ICD-10-CM | POA: Diagnosis present

## 2022-03-04 DIAGNOSIS — K4020 Bilateral inguinal hernia, without obstruction or gangrene, not specified as recurrent: Secondary | ICD-10-CM | POA: Diagnosis not present

## 2022-03-04 DIAGNOSIS — Z7982 Long term (current) use of aspirin: Secondary | ICD-10-CM | POA: Diagnosis not present

## 2022-03-04 DIAGNOSIS — Z79899 Other long term (current) drug therapy: Secondary | ICD-10-CM | POA: Insufficient documentation

## 2022-03-04 DIAGNOSIS — Z7984 Long term (current) use of oral hypoglycemic drugs: Secondary | ICD-10-CM | POA: Diagnosis not present

## 2022-03-04 DIAGNOSIS — W0110XA Fall on same level from slipping, tripping and stumbling with subsequent striking against unspecified object, initial encounter: Secondary | ICD-10-CM | POA: Insufficient documentation

## 2022-03-04 LAB — CBC WITH DIFFERENTIAL/PLATELET
Abs Immature Granulocytes: 0.07 10*3/uL (ref 0.00–0.07)
Basophils Absolute: 0.1 10*3/uL (ref 0.0–0.1)
Basophils Relative: 1 %
Eosinophils Absolute: 0.6 10*3/uL — ABNORMAL HIGH (ref 0.0–0.5)
Eosinophils Relative: 3 %
HCT: 48.7 % (ref 39.0–52.0)
Hemoglobin: 16.7 g/dL (ref 13.0–17.0)
Immature Granulocytes: 0 %
Lymphocytes Relative: 14 %
Lymphs Abs: 2.3 10*3/uL (ref 0.7–4.0)
MCH: 30.4 pg (ref 26.0–34.0)
MCHC: 34.3 g/dL (ref 30.0–36.0)
MCV: 88.7 fL (ref 80.0–100.0)
Monocytes Absolute: 1.5 10*3/uL — ABNORMAL HIGH (ref 0.1–1.0)
Monocytes Relative: 9 %
Neutro Abs: 12.3 10*3/uL — ABNORMAL HIGH (ref 1.7–7.7)
Neutrophils Relative %: 73 %
Platelets: 203 10*3/uL (ref 150–400)
RBC: 5.49 MIL/uL (ref 4.22–5.81)
RDW: 12.3 % (ref 11.5–15.5)
WBC: 16.8 10*3/uL — ABNORMAL HIGH (ref 4.0–10.5)
nRBC: 0 % (ref 0.0–0.2)

## 2022-03-04 LAB — BASIC METABOLIC PANEL
Anion gap: 8 (ref 5–15)
BUN: 17 mg/dL (ref 8–23)
CO2: 27 mmol/L (ref 22–32)
Calcium: 9 mg/dL (ref 8.9–10.3)
Chloride: 99 mmol/L (ref 98–111)
Creatinine, Ser: 0.91 mg/dL (ref 0.61–1.24)
GFR, Estimated: >60 mL/min (ref >60)
Glucose, Bld: 217 mg/dL — ABNORMAL HIGH (ref 70–99)
Potassium: 3.8 mmol/L (ref 3.5–5.1)
Sodium: 134 mmol/L — ABNORMAL LOW (ref 135–145)

## 2022-03-04 LAB — RESP PANEL BY RT-PCR (FLU A&B, COVID) ARPGX2
Influenza A by PCR: NEGATIVE
Influenza B by PCR: NEGATIVE
SARS Coronavirus 2 by RT PCR: NEGATIVE

## 2022-03-04 MED ORDER — ONDANSETRON HCL 4 MG/2ML IJ SOLN
4.0000 mg | Freq: Once | INTRAMUSCULAR | Status: AC
  Administered 2022-03-04: 4 mg via INTRAVENOUS
  Filled 2022-03-04: qty 2

## 2022-03-04 MED ORDER — HYDROMORPHONE HCL 1 MG/ML IJ SOLN
1.0000 mg | Freq: Once | INTRAMUSCULAR | Status: AC
  Administered 2022-03-04: 1 mg via INTRAVENOUS
  Filled 2022-03-04: qty 1

## 2022-03-04 MED ORDER — OXYCODONE-ACETAMINOPHEN 5-325 MG PO TABS: 1.0000 | ORAL_TABLET | Freq: Four times a day (QID) | ORAL | 0 refills | Status: DC | PRN

## 2022-03-04 MED ORDER — ALBUTEROL SULFATE HFA 108 (90 BASE) MCG/ACT IN AERS
2.0000 | INHALATION_SPRAY | RESPIRATORY_TRACT | Status: DC | PRN
Start: 2022-03-04 — End: 2022-03-04

## 2022-03-04 MED ORDER — IOHEXOL 300 MG/ML  SOLN
100.0000 mL | Freq: Once | INTRAMUSCULAR | Status: AC | PRN
  Administered 2022-03-04: 100 mL via INTRAVENOUS

## 2022-03-04 MED ORDER — OXYCODONE-ACETAMINOPHEN 5-325 MG PO TABS: 1.0000 | ORAL_TABLET | ORAL | 0 refills | Status: DC | PRN

## 2022-03-04 NOTE — ED Triage Notes (Signed)
Pt states he fell last Sunday and landed on L side. Was seen at Urgent Care and had CXR but stated that it came back normal. Pt here tonight with increased pain to L ribcage and SOB. Pt also has had a cough x 3 days.

## 2022-03-04 NOTE — Progress Notes (Signed)
Patient still having pain in left side.  Gave Incentive Spirometer to try to encourage patient to take deeper breaths.  Patient complaining that he cannot take deep breaths at this time, so I encouraged him to use IS when he gets pain meds.  Patient sat has been fluctuating; has been dipping into upper 80s at times, placed patient on 2L [place].  Will continue to monitor.

## 2022-03-04 NOTE — ED Provider Notes (Signed)
[facility] EMERGENCY DEPARTMENT Provider Note   CSN: [id number] Arrival date & time: 03/04/22  0015     History  Chief Complaint  Patient presents with   Shortness of Breath   L. Rib Pain    Manuel [name] is a 66 y.o. male.  Patient presents for evaluation of left-sided chest pain and cough.  Patient reports that he fell 1 week ago, hitting the left side of his chest.  He has been having rib pain since the fall.  He reports that he was seen at urgent care, had an x-ray and was told that there was no findings.  Over the last 3 days he has developed a progressively worsening cough and shortness of breath.       Home Medications Prior to Admission medications   Medication Sig Start Date End Date Taking? Authorizing Provider  oxyCODONE-acetaminophen (PERCOCET) 5-325 MG tablet Take 1-2 tablets by mouth every 4 (four) hours as needed. 03/04/22  Yes Edwin Cherian, Gwenyth Allegra, MD  allopurinol (ZYLOPRIM) 300 MG tablet Take 300 mg by mouth daily.    [provider]  amoxicillin-clavulanate (AUGMENTIN) 875-125 MG tablet Take 1 tablet by mouth every 12 (twelve) hours. 11/29/21   Summerlin, Berneice Heinrich, PA-C  aspirin (ASPIRIN EC) 81 MG EC tablet Take 81 mg by mouth daily. Swallow whole. 01/15/17   Rogene Houston, MD  aspirin EC 325 MG tablet Take 325-650 mg by mouth every 8 (eight) hours as needed (for pain.).    [provider]  atenolol (TENORMIN) 25 MG tablet Take 25 mg by mouth daily.    [provider]  CINNAMON PO Take 1,000 mg by mouth 2 (two) times daily.    [provider]  indomethacin (INDOCIN) 50 MG capsule Take 50 mg by mouth 3 (three) times daily as needed (FOR GOUT FLARES UP).     [provider]  losartan (COZAAR) 100 MG tablet Take 100 mg by mouth daily.    [provider]  metFORMIN (GLUCOPHAGE) 500 MG tablet Take 1 tablet (500 mg total) by mouth 2 (two) times daily with a meal. 07/04/21   Volney American, PA-C   metFORMIN (GLUCOPHAGE) 500 MG tablet Take by mouth 2 (two) times daily with a meal.    [provider]  methocarbamol (ROBAXIN) 500 MG tablet Take 1 tablet (500 mg total) by mouth 2 (two) times daily as needed for muscle spasms. 07/30/20   Noemi Chapel, MD  mirabegron ER (MYRBETRIQ) 25 MG TB24 tablet Take 1 tablet (25 mg total) by mouth daily. 01/16/22   [name], Candee Furbish, MD  naproxen (NAPROSYN) 500 MG tablet Take 1 tablet (500 mg total) by mouth 2 (two) times daily with a meal. 07/30/20   Noemi Chapel, MD  oxyCODONE-acetaminophen (PERCOCET/ROXICET) 5-325 MG tablet Take 1 tablet by mouth every 6 (six) hours as needed for severe pain. 03/04/22  Yes Tiffanie Blassingame, Gwenyth Allegra, MD  predniSONE (DELTASONE) 20 MG tablet Take 2 tablets (40 mg total) by mouth daily. 07/30/20   Noemi Chapel, MD  silodosin (RAPAFLO) 8 MG CAPS capsule Take 1 capsule (8 mg total) by mouth at bedtime. 12/17/21   [name], Candee Furbish, MD  tamsulosin (FLOMAX) 0.4 MG CAPS capsule Take 1 capsule (0.4 mg total) by mouth daily. 11/16/21   [name], Candee Furbish, MD  valsartan-hydrochlorothiazide (DIOVAN-HCT) 320-12.5 MG per tablet Take 1 tablet by mouth daily.    [provider]      Allergies    Patient has no known allergies.  Review of Systems   Review of Systems  Physical Exam Updated Vital Signs BP 130/77   Pulse 93   Temp 97.7 F (36.5 C) (Oral)   Resp 13   Ht '6\' 2"'$  (1.88 m)   Wt (!) 137 kg   SpO2 99%   BMI 38.77 kg/m  Physical Exam Vitals and nursing note reviewed.  Constitutional:      General: He is not in acute distress.    Appearance: He is well-developed.  HENT:     Head: Normocephalic and atraumatic.     Mouth/Throat:     Mouth: Mucous membranes are moist.  Eyes:     General: Vision grossly intact. Gaze aligned appropriately.     Extraocular Movements: Extraocular movements intact.     Conjunctiva/sclera: Conjunctivae normal.  Cardiovascular:     Rate and Rhythm: Normal rate and  regular rhythm.     Pulses: Normal pulses.     Heart sounds: Normal heart sounds, S1 normal and S2 normal. No murmur heard.    No friction rub. No gallop.  Pulmonary:     Effort: Pulmonary effort is normal. No respiratory distress.     Breath sounds: Normal breath sounds.  Chest:     Chest wall: Tenderness present.    Abdominal:     Palpations: Abdomen is soft.     Tenderness: There is no abdominal tenderness. There is no guarding or rebound.     Hernia: No hernia is present.  Musculoskeletal:        General: No swelling.     Cervical back: Full passive range of motion without pain, normal range of motion and neck supple. No pain with movement, spinous process tenderness or muscular tenderness. Normal range of motion.     Right lower leg: No edema.     Left lower leg: No edema.  Skin:    General: Skin is warm and dry.     Capillary Refill: Capillary refill takes less than 2 seconds.     Findings: No ecchymosis, erythema, lesion or wound.  Neurological:     Mental Status: He is alert and oriented to person, place, and time.     GCS: GCS eye subscore is 4. GCS verbal subscore is 5. GCS motor subscore is 6.     Cranial Nerves: Cranial nerves 2-12 are intact.     Sensory: Sensation is intact.     Motor: Motor function is intact. No weakness or abnormal muscle tone.     Coordination: Coordination is intact.  Psychiatric:        Mood and Affect: Mood normal.        Speech: Speech normal.        Behavior: Behavior normal.     ED Results / Procedures / Treatments   Labs (all labs ordered are listed, but only abnormal results are displayed) Labs Reviewed  CBC WITH DIFFERENTIAL/PLATELET - Abnormal; Notable for the following components:      Result Value   WBC 16.8 (*)    Neutro Abs 12.3 (*)    Monocytes Absolute 1.5 (*)    Eosinophils Absolute 0.6 (*)    All other components within normal limits  BASIC METABOLIC PANEL - Abnormal; Notable for the following components:   Sodium  134 (*)    Glucose, Bld 217 (*)    All other components within normal limits  RESP PANEL BY RT-PCR (FLU A&B, COVID) [id number]    EKG EKG Interpretation  Date/Time:  Monday March 04 2022  00:28:06 EST Ventricular Rate:  97 PR Interval:  179 QRS Duration: 132 QT Interval:  337 QTC Calculation: 431 R Axis:   7 Text Interpretation: Sinus rhythm Nonspecific intraventricular conduction delay Confirmed by Orpah Greek [phone]) on 03/04/2022 1:59:47 AM  Radiology CT CHEST ABDOMEN PELVIS W CONTRAST  Result Date: 03/04/2022 CLINICAL DATA:  Polytrauma, blunt [id number]. Fall, left chest pain, cough, dyspnea. EXAM: CT CHEST, ABDOMEN, AND PELVIS WITH CONTRAST TECHNIQUE: Multidetector CT imaging of the chest, abdomen and pelvis was performed following the standard protocol during bolus administration of intravenous contrast. RADIATION DOSE REDUCTION: This exam was performed according to the departmental dose-optimization program which includes automated exposure control, adjustment of the mA and/or kV according to patient size and/or use of iterative reconstruction technique. CONTRAST:  168m OMNIPAQUE IOHEXOL 300 MG/ML  SOLN COMPARISON:  12/08/2021 FINDINGS: CT CHEST FINDINGS Cardiovascular: Calcification of the aortic valve leaflets noted. Mild coronary artery calcification. Global cardiac size within normal limits. No pericardial effusion. Central pulmonary arteries are of normal caliber. Mild atherosclerotic calcification within the thoracic aorta. No aortic aneurysm. Mediastinum/Nodes: No enlarged mediastinal, hilar, or axillary lymph nodes. Thyroid gland, trachea, and esophagus demonstrate no significant findings. Lungs/Pleura: Mild bibasilar atelectasis. Minimal subpleural reticulation within the lung bases bilaterally may represent minimal pulmonary fibrotic change. Lungs are otherwise clear. No pneumothorax or pleural effusion. No central obstructing lesion. Musculoskeletal: No acute bone  abnormality. Osseous structures are age-appropriate. CT ABDOMEN PELVIS FINDINGS Hepatobiliary: No focal liver abnormality is seen. No gallstones, gallbladder wall thickening, or biliary dilatation. Pancreas: Unremarkable Spleen: Unremarkable Adrenals/Urinary Tract: Adrenal glands are unremarkable. Kidneys are normal, without renal calculi, focal lesion, or hydronephrosis. Bladder is unremarkable. Stomach/Bowel: Stomach is within normal limits. Appendix appears normal. No evidence of bowel wall thickening, distention, or inflammatory changes. Vascular/Lymphatic: Aortic atherosclerosis. No enlarged abdominal or pelvic lymph nodes. Reproductive: Prostate is unremarkable. Other: Tiny fat containing umbilical and small bilateral fat containing inguinal hernias. Musculoskeletal: Degenerative changes are seen within the lumbar spine. No acute bone abnormality. No lytic or blastic bone lesion. IMPRESSION: 1. No acute intrathoracic or intra-abdominal injury identified. 2. Calcification of the aortic valve leaflets. Echocardiography may be helpful to assess the degree of valvular dysfunction. 3. Mild coronary artery calcification. 4.  Aortic Atherosclerosis (ICD10-I70.0). Electronically Signed   By: AFidela SalisburyM.D.   On: 03/04/2022 02:49   DG Chest Portable 1 View  Result Date: 03/04/2022 CLINICAL DATA:  Shortness of breath and left rib pain EXAM: PORTABLE CHEST 1 VIEW COMPARISON:  11/26/12 FINDINGS: Cardiac shadow is stable. Lungs are well aerated bilaterally. No focal infiltrate or effusion is seen. No bony abnormality is noted. IMPRESSION: No active disease. Electronically Signed   By: MInez CatalinaM.D.   On: 03/04/2022 00:51    Procedures Procedures    Medications Ordered in ED Medications  albuterol (VENTOLIN HFA) 108 (90 Base) MCG/ACT inhaler 2 puff (has no administration in time range)  HYDROmorphone (DILAUDID) injection 1 mg (1 mg Intravenous Given 03/04/22 0117)  ondansetron (ZOFRAN) injection 4 mg  (4 mg Intravenous Given 03/04/22 0117)  iohexol (OMNIPAQUE) 300 MG/ML solution 100 mL (100 mLs Intravenous Contrast Given 03/04/22 0208)    ED Course/ Medical Decision Making/ A&P                           Medical Decision Making Amount and/or Complexity of Data Reviewed Labs: ordered. Radiology: ordered.  Risk Prescription drug management.   Patient presents to the emergency  department for evaluation of left rib pain after a fall that occurred 1 week ago.  Patient now experiencing cough and shortness of breath for 3 days.  Examination reveals lower anterior left rib tenderness.  Difficult to ascertain if he has left upper abdominal pain or rib pain with palpation on the abdomen.  Chest x-ray unremarkable.  Patient underwent CT chest, abdomen, pelvis to further evaluate.  No acute pathology noted.  No pneumonia.  No pneumothorax or pulmonary contusion.  Patient does have a productive cough.  Will require analgesia, prompt follow-up.  Given return precautions including signs of pneumonia, worsening pain, shortness of breath, fever.  Incentive spirometry teaching performed.        Final Clinical Impression(s) / ED Diagnoses Final diagnoses:  Rib contusion, left, initial encounter    Rx / DC Orders ED Discharge Orders          Ordered    oxyCODONE-acetaminophen (PERCOCET/ROXICET) 5-325 MG tablet  Every 6 hours PRN        03/04/22 0405    oxyCODONE-acetaminophen (PERCOCET) 5-325 MG tablet  Every 4 hours PRN        03/04/22 0406              Orpah Greek, MD 03/04/22 [phone]

## 2022-03-04 NOTE — ED Notes (Signed)
Went over [employer]. Gave prepack. Ambulatory to lobby.

## 2022-03-05 MED FILL — Oxycodone w/ Acetaminophen Tab 5-325 MG: ORAL | Qty: 6 | Status: AC

## 2022-03-13 ENCOUNTER — Ambulatory Visit: Payer: PRIVATE HEALTH INSURANCE | Admitting: Urology

## 2022-03-26 ENCOUNTER — Encounter (HOSPITAL_COMMUNITY): Payer: Self-pay

## 2022-03-26 ENCOUNTER — Other Ambulatory Visit: Payer: Self-pay

## 2022-03-26 ENCOUNTER — Encounter (HOSPITAL_COMMUNITY)
Admission: RE | Admit: 2022-03-26 | Discharge: 2022-03-26 | Disposition: A | Discharge status: Home / Self Care | Payer: PRIVATE HEALTH INSURANCE | Source: Ambulatory Visit | Attending: Urology | Admitting: Urology

## 2022-03-28 ENCOUNTER — Ambulatory Visit (HOSPITAL_COMMUNITY)
Admission: RE | Admit: 2022-03-28 | Discharge: 2022-03-28 | Disposition: A | Discharge status: Home / Self Care | Payer: PRIVATE HEALTH INSURANCE | Attending: Urology | Admitting: Urology

## 2022-03-28 ENCOUNTER — Ambulatory Visit (HOSPITAL_BASED_OUTPATIENT_CLINIC_OR_DEPARTMENT_OTHER): Payer: PRIVATE HEALTH INSURANCE | Admitting: Certified Registered Nurse Anesthetist

## 2022-03-28 ENCOUNTER — Ambulatory Visit (HOSPITAL_COMMUNITY): Payer: PRIVATE HEALTH INSURANCE | Admitting: Certified Registered Nurse Anesthetist

## 2022-03-28 ENCOUNTER — Encounter (HOSPITAL_COMMUNITY)
Admission: RE | Disposition: A | Discharge status: Home / Self Care | Payer: Self-pay | Source: Home / Self Care | Attending: Urology

## 2022-03-28 ENCOUNTER — Encounter (HOSPITAL_COMMUNITY): Payer: Self-pay | Admitting: Urology

## 2022-03-28 DIAGNOSIS — I10 Essential (primary) hypertension: Secondary | ICD-10-CM | POA: Insufficient documentation

## 2022-03-28 DIAGNOSIS — Z7984 Long term (current) use of oral hypoglycemic drugs: Secondary | ICD-10-CM | POA: Diagnosis not present

## 2022-03-28 DIAGNOSIS — N35011 Post-traumatic bulbous urethral stricture: Secondary | ICD-10-CM

## 2022-03-28 DIAGNOSIS — E119 Type 2 diabetes mellitus without complications: Secondary | ICD-10-CM | POA: Insufficient documentation

## 2022-03-28 HISTORY — PX: CYSTOSCOPY WITH URETHRAL DILATATION: SHX5125

## 2022-03-28 LAB — GLUCOSE, CAPILLARY: Glucose-Capillary: 170 mg/dL — ABNORMAL HIGH (ref 70–99)

## 2022-03-28 SURGERY — CYSTOSCOPY, WITH URETHRAL DILATION
Anesthesia: General | Site: Urethra

## 2022-03-28 MED ORDER — CEFAZOLIN SODIUM-DEXTROSE 2-4 GM/100ML-% IV SOLN: 2.0000 g | INTRAVENOUS | Status: DC

## 2022-03-28 MED ORDER — EPHEDRINE 5 MG/ML INJ
INTRAVENOUS | Status: AC
  Filled 2022-03-28: qty 10

## 2022-03-28 MED ORDER — LACTATED RINGERS IV SOLN: INTRAVENOUS | Status: DC | PRN

## 2022-03-28 MED ORDER — MIDAZOLAM HCL 2 MG/2ML IJ SOLN
INTRAMUSCULAR | Status: AC
  Filled 2022-03-28: qty 2

## 2022-03-28 MED ORDER — PHENYLEPHRINE 80 MCG/ML (10ML) SYRINGE FOR IV PUSH (FOR BLOOD PRESSURE SUPPORT)
PREFILLED_SYRINGE | INTRAVENOUS | Status: DC | PRN
  Administered 2022-03-28: 80 ug via INTRAVENOUS

## 2022-03-28 MED ORDER — OXYCODONE-ACETAMINOPHEN 5-325 MG PO TABS: 1.0000 | ORAL_TABLET | ORAL | 0 refills | Status: DC | PRN

## 2022-03-28 MED ORDER — PHENYLEPHRINE 80 MCG/ML (10ML) SYRINGE FOR IV PUSH (FOR BLOOD PRESSURE SUPPORT)
PREFILLED_SYRINGE | INTRAVENOUS | Status: AC
  Filled 2022-03-28: qty 20

## 2022-03-28 MED ORDER — CEFAZOLIN IN SODIUM CHLORIDE 3-0.9 GM/100ML-% IV SOLN
3.0000 g | INTRAVENOUS | Status: AC
  Administered 2022-03-28: 3 g via INTRAVENOUS

## 2022-03-28 MED ORDER — CEFAZOLIN IN SODIUM CHLORIDE 3-0.9 GM/100ML-% IV SOLN
INTRAVENOUS | Status: AC
  Filled 2022-03-28: qty 100

## 2022-03-28 MED ORDER — FENTANYL CITRATE (PF) 100 MCG/2ML IJ SOLN
INTRAMUSCULAR | Status: DC | PRN
  Administered 2022-03-28: 100 ug via INTRAVENOUS
  Administered 2022-03-28 (×3): 25 ug via INTRAVENOUS

## 2022-03-28 MED ORDER — DEXTROSE 5 % IV SOLN: INTRAVENOUS | Status: DC | PRN

## 2022-03-28 MED ORDER — PROPOFOL 10 MG/ML IV BOLUS
INTRAVENOUS | Status: AC
  Filled 2022-03-28: qty 20

## 2022-03-28 MED ORDER — PROPOFOL 10 MG/ML IV BOLUS
INTRAVENOUS | Status: DC | PRN
  Administered 2022-03-28: 200 mg via INTRAVENOUS

## 2022-03-28 MED ORDER — LIDOCAINE 2% (20 MG/ML) 5 ML SYRINGE
INTRAMUSCULAR | Status: DC | PRN
  Administered 2022-03-28: 100 mg via INTRAVENOUS

## 2022-03-28 MED ORDER — FENTANYL CITRATE (PF) 100 MCG/2ML IJ SOLN
INTRAMUSCULAR | Status: AC
  Filled 2022-03-28: qty 2

## 2022-03-28 MED ORDER — MIDAZOLAM HCL 5 MG/5ML IJ SOLN
INTRAMUSCULAR | Status: DC | PRN
  Administered 2022-03-28: 2 mg via INTRAVENOUS

## 2022-03-28 MED ORDER — WATER FOR IRRIGATION, STERILE IR SOLN
Status: DC | PRN
  Administered 2022-03-28: 500 mL
  Administered 2022-03-28: 3000 mL

## 2022-03-28 MED ORDER — DEXAMETHASONE SODIUM PHOSPHATE 10 MG/ML IJ SOLN
INTRAMUSCULAR | Status: DC | PRN
  Administered 2022-03-28: 10 mg via INTRAVENOUS

## 2022-03-28 MED ORDER — ONDANSETRON HCL 4 MG/2ML IJ SOLN
INTRAMUSCULAR | Status: DC | PRN
  Administered 2022-03-28: 4 mg via INTRAVENOUS

## 2022-03-28 SURGICAL SUPPLY — 23 items
BAG DRAIN URO TABLE W/ADPT NS (BAG) ×1 IMPLANT
BAG DRN 8 ADPR NS SKTRN CSTL (BAG) ×1
BAG DRN RND TRDRP ANRFLXCHMBR (UROLOGICAL SUPPLIES) ×1
BAG HAMPER (MISCELLANEOUS) ×1 IMPLANT
BAG URINE DRAIN 2000ML AR STRL (UROLOGICAL SUPPLIES) ×1 IMPLANT
BALLN NEPHROSTOMY (BALLOONS) ×1
BALLOON NEPHROSTOMY (BALLOONS) ×1 IMPLANT
CATH FOLEY 2WAY SLVR  5CC 18FR (CATHETERS) ×1
CATH FOLEY 2WAY SLVR 5CC 18FR (CATHETERS) ×1 IMPLANT
CLOTH BEACON ORANGE TIMEOUT ST (SAFETY) ×1 IMPLANT
GLOVE BIO SURGEON STRL SZ8 (GLOVE) ×1 IMPLANT
GLOVE BIOGEL PI IND STRL 7.0 (GLOVE) ×2 IMPLANT
GOWN STRL REUS W/TWL LRG LVL3 (GOWN DISPOSABLE) ×1 IMPLANT
GOWN STRL REUS W/TWL XL LVL3 (GOWN DISPOSABLE) ×1 IMPLANT
GUIDEWIRE STR DUAL SENSOR (WIRE) ×1 IMPLANT
KIT CHEMO SPILL (MISCELLANEOUS) IMPLANT
KIT TURNOVER CYSTO (KITS) ×1 IMPLANT
MANIFOLD NEPTUNE II (INSTRUMENTS) ×1 IMPLANT
PACK CYSTO (CUSTOM PROCEDURE TRAY) ×1 IMPLANT
PAD ARMBOARD 7.5X6 YLW CONV (MISCELLANEOUS) ×1 IMPLANT
TOWEL NATURAL 4PK STERILE (DISPOSABLE) ×1 IMPLANT
WATER STERILE IRR 3000ML UROMA (IV SOLUTION) ×1 IMPLANT
WATER STERILE IRR 500ML POUR (IV SOLUTION) ×1 IMPLANT

## 2022-03-28 NOTE — Transfer of Care (Signed)
Immediate Anesthesia Transfer of Care Note  Patient: Manuel [name]  Procedure(s) Performed: CYSTOSCOPY WITH URETHRAL DILATATION (Urethra)  Patient Location: PACU  Anesthesia Type:General  Level of Consciousness: awake, alert , and oriented  Airway & Oxygen Therapy: Patient Spontanous Breathing and Patient connected to face mask oxygen  Post-op Assessment: Report given to RN and Post -op Vital signs reviewed and stable  Post vital signs: Reviewed and stable  Last Vitals:  Vitals Value Taken Time  BP 147/86   Temp 97   Pulse 78   Resp 14   SpO2 98%     Last Pain:  Vitals:   03/28/22 0728  TempSrc: Oral  PainSc:          Complications: No notable events documented.

## 2022-03-28 NOTE — H&P (Signed)
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 He has noticed difficulty urinating for the past 6 months and had failed multiple alpha blockers. He underwent office cystoscopy 1 month ago and was found to have a dense bulbar urethral stricture. He has straining to urinate, a weak urine stream and intermittent dysuria.   Past Medical History:  Diagnosis Date   CKD (chronic kidney disease) 01/08/2017   patient states he does not have.   Diabetes mellitus without complication ([place])    Essential hypertension, benign 01/08/2017   Gout    High cholesterol 01/08/2017   Hypertension    Obesity    Past Surgical History:  Procedure Laterality Date   CIRCUMCISION     COLONOSCOPY N/A 01/14/2017   Procedure: COLONOSCOPY;  Surgeon: Rogene Houston, MD;  [facility]: [facility];  Service: Endoscopy;  Laterality: N/A;   DENTAL SURGERY     teeth removed    Home Medications:  Current Facility-Administered Medications  Medication Dose Route Frequency Provider Last Rate Last Admin   ceFAZolin (ANCEF) 3-0.9 GM/100ML-% IVPB            ceFAZolin (ANCEF) IVPB 3g/100 mL premix  3 g Intravenous 30 min Pre-Op Eryk Beavers, Candee Furbish, MD       Allergies: No Known Allergies  Family History  Problem Relation Age of Onset   Colon cancer Brother    Social History:  reports that he has never smoked. He has never been exposed to tobacco smoke. He has never used smokeless tobacco. He reports current alcohol use. He reports that he does not use drugs.  Review of Systems  Genitourinary:  Positive for difficulty urinating and dysuria.  All other systems reviewed and are negative.   Physical Exam:  Vital signs in last 24 hours: Temp:  [98.6 F (37 C)] 98.6 F (37 C) (01/04 0728) Pulse Rate:  [84] 84 (01/04 0728) Resp:  [20] 20 (01/04 0728) BP: (138)/(72) 138/72 (01/04 0728) SpO2:   [97 %] 97 % (01/04 0728) Weight:  [500 kg] 137 kg (01/04 0700) Physical Exam Vitals reviewed.  Constitutional:      Appearance: Normal appearance.  HENT:     Head: Normocephalic and atraumatic.     Mouth/Throat:     Mouth: Mucous membranes are dry.  Eyes:     Extraocular Movements: Extraocular movements intact.     Pupils: Pupils are equal, round, and reactive to light.  Cardiovascular:     Rate and Rhythm: Normal rate and regular rhythm.  Pulmonary:     Effort: Pulmonary effort is normal. No respiratory distress.  Abdominal:     General: Abdomen is flat. There is no distension.  Musculoskeletal:        General: No swelling. Normal range of motion.     Cervical back: Normal range of motion and neck supple.  Skin:    General: Skin is warm and dry.  Neurological:     General: No focal deficit present.     Mental Status: He is alert and oriented to person, place, and time.  Psychiatric:        Mood and Affect: Mood normal.        Behavior: Behavior normal.        Thought Content: Thought content normal.        Judgment: Judgment normal.     Laboratory Data:  Results for orders placed  or performed during the hospital encounter of 03/28/22 (from the past 24 hour(s))  Glucose, capillary     Status: Abnormal   Collection Time: 03/28/22  7:36 AM  Result Value Ref Range   Glucose-Capillary 170 (H) 70 - 99 mg/dL   No results found for this or any previous visit (from the past 240 hour(s)). Creatinine: No results for input(s): "CREATININE" in the last 168 hours. Baseline Creatinine:   Impression/Assessment:  66yo with a urethral stricture  Plan:  We discussed the management of urethral stricture including balloon dilation, DVIU, and urethroplasty. After discussing the options the patient elects fro balloon dilation.     Manuel [name] 03/28/2022, 7:39 AM

## 2022-03-28 NOTE — Op Note (Signed)
Preoperative diagnosis: bulbar urethral stricture   Postoperative diagnosis: Same   Procedure: 1. Cystoscopy 2. Urethral dilation   Attending: [name]   Anesthesia: General   History of blood loss: Minimal   Antibiotics: Ancef   Specimens: none   Drains: 18 french foley   Findings: dense bulbar urethral stricture, 1cm segment.  No masses/lesions in the bladder.   Indications: Patient is a 67 year old male with a history of urethral stricture who developed a weak urinary stream and was found to have a bulbar urethral stricture. After discussing treatment options the patient wishes to proceed with cystoscopy with urethral dilation and optilume treatment.   Procedure in detail: Prior to procedure consent was obtained.  Patient was brought to the operating room and a brief timeout was done to ensure correct patient, correct procedure, correct site.  General anesthesia was administered and patient was placed in dorsal lithotomy position.  His genitalia was then prepped and draped in usual sterile fashion. We then passed a 32 French cystoscope up to the bulbar urethra where we encountered a dense stricture. . We were able to pass a wire across the stricture and into the bladder. We then advanced a 30 french urethral dilation balloon across the stricture under direct vision. We then inflated the balloon to 16cm of water and held the pressure for 3 minutes.  We then deflated the balloon and removed the balloon and the wire. We then placed a 18 french foley and the bladder was drained.This then concluded the procedure which was well tolerated by the patient.   Complications: None   Condition: Stable, extubated, transferred to PACU.   Plan: Patient is to be discharged home and followup in 5 days for a voiding trial.

## 2022-03-28 NOTE — Anesthesia Preprocedure Evaluation (Signed)
Anesthesia Evaluation  Patient identified by MRN, date of birth, ID band Patient awake    Reviewed: Allergy & Precautions, NPO status , Patient's Chart, lab work & pertinent test results  Airway Mallampati: I  TM Distance: >3 FB Neck ROM: Full    Dental  (+) Upper Dentures, Lower Dentures   Pulmonary neg pulmonary ROS   Pulmonary exam normal breath sounds clear to auscultation       Cardiovascular hypertension, Normal cardiovascular exam Rhythm:Regular Rate:Normal     Neuro/Psych negative neurological ROS  negative psych ROS   GI/Hepatic negative GI ROS, Neg liver ROS,,,  Endo/Other  diabetes, Type 2    Renal/GU negative Renal ROS   Post-traumatic bulbous urethral stricture    Musculoskeletal negative musculoskeletal ROS (+)    Abdominal   Peds negative pediatric ROS (+)  Hematology negative hematology ROS (+)   Anesthesia Other Findings   Reproductive/Obstetrics negative OB ROS                              Anesthesia Physical Anesthesia Plan  ASA: 3  Anesthesia Plan: General   Post-op Pain Management:    Induction:   PONV Risk Score and Plan:   Airway Management Planned: LMA  Additional Equipment: None  Intra-op Plan:   Post-operative Plan: Extubation in OR  Informed Consent: I have reviewed the patients History and Physical, chart, labs and discussed the procedure including the risks, benefits and alternatives for the proposed anesthesia with the patient or authorized representative who has indicated his/her understanding and acceptance.       Plan Discussed with: CRNA  Anesthesia Plan Comments:          Anesthesia Quick Evaluation

## 2022-03-28 NOTE — Anesthesia Postprocedure Evaluation (Signed)
Anesthesia Post Note  Patient: [name]  Procedure(s) Performed: CYSTOSCOPY WITH URETHRAL DILATATION (Urethra)  Patient location during evaluation: PACU Anesthesia Type: General Level of consciousness: awake and alert Pain management: pain level controlled Vital Signs Assessment: post-procedure vital signs reviewed and stable Respiratory status: spontaneous breathing, nonlabored ventilation, respiratory function stable and patient connected to nasal cannula oxygen Cardiovascular status: blood pressure returned to baseline and stable Postop Assessment: no apparent nausea or vomiting Anesthetic complications: no   There were no known notable events for this encounter.   Last Vitals:  Vitals:   03/28/22 1000 03/28/22 1012  BP: (!) 154/82 (!) 147/71  Pulse: 67 70  Resp: (!) 21 (!) 24  Temp:  36.7 C  SpO2: 93% 93%    Last Pain:  Vitals:   03/28/22 1012  TempSrc:   PainSc: 0-No pain                 Nicanor Alcon

## 2022-03-28 NOTE — Anesthesia Procedure Notes (Signed)
Procedure Name: LMA Insertion Date/Time: 03/28/2022 9:03 AM  Performed by: Jeral Pinch, RNPre-anesthesia Checklist: Patient identified, Emergency Drugs available, Suction available, Patient being monitored and Timeout performed Patient Re-evaluated:Patient Re-evaluated prior to induction Oxygen Delivery Method: Circle system utilized Preoxygenation: Pre-oxygenation with 100% oxygen Induction Type: IV induction Ventilation: Mask ventilation with difficulty LMA: LMA inserted LMA Size: 4.0 Number of attempts: 2 (LMA 5 inserted first, tidal volumes not acheived, size 4 inserted without difficulty) Dental Injury: Teeth and Oropharynx as per pre-operative assessment

## 2022-04-02 ENCOUNTER — Encounter (HOSPITAL_COMMUNITY): Payer: Self-pay | Admitting: Urology

## 2022-04-03 ENCOUNTER — Ambulatory Visit: Payer: PRIVATE HEALTH INSURANCE | Admitting: Urology

## 2022-04-03 ENCOUNTER — Encounter: Payer: Self-pay | Admitting: Urology

## 2022-04-03 VITALS — BP 129/77 | HR 103

## 2022-04-03 DIAGNOSIS — N138 Other obstructive and reflux uropathy: Secondary | ICD-10-CM

## 2022-04-03 DIAGNOSIS — N401 Enlarged prostate with lower urinary tract symptoms: Secondary | ICD-10-CM | POA: Diagnosis not present

## 2022-04-03 DIAGNOSIS — R339 Retention of urine, unspecified: Secondary | ICD-10-CM

## 2022-04-03 MED ORDER — SILODOSIN 8 MG PO CAPS: 8.0000 mg | ORAL_CAPSULE | Freq: Every day | ORAL | 11 refills | Status: DC

## 2022-04-03 NOTE — Progress Notes (Signed)
04/03/2022 10:59 AM   Manuel [name] February 25, 1956 [id number]  Referring provider: Monico Blitz, MD [address] [place],  [place] [address]  Followup urethral stricture   HPI: Manuel [name] is a 67yo here for followup for a urethral stricture. Voiding trial passed today. Mild burning with urination. NO straining to urinate. No hematuria. No other complaints today   PMH: Past Medical History:  Diagnosis Date   CKD (chronic kidney disease) 01/08/2017   patient states he does not have.   Diabetes mellitus without complication ([place])    Essential hypertension, benign 01/08/2017   Gout    High cholesterol 01/08/2017   Hypertension    Obesity     Surgical History: Past Surgical History:  Procedure Laterality Date   CIRCUMCISION     COLONOSCOPY N/A 01/14/2017   Procedure: COLONOSCOPY;  Surgeon: Manuel Houston, MD;  [facility];  Service: Endoscopy;  Laterality: N/A;   CYSTOSCOPY WITH URETHRAL DILATATION N/A 03/28/2022   Procedure: CYSTOSCOPY WITH URETHRAL DILATATION;  Surgeon: Manuel Gustin, MD;  Location: [facility];  Service: Urology;  Laterality: N/A;   DENTAL SURGERY     teeth removed    Home Medications:  Allergies as of 04/03/2022   No Known Allergies      Medication List        Accurate as of April 03, 2022 10:59 AM. If you have any questions, ask your nurse or doctor.          alfuzosin 10 MG 24 hr tablet Commonly known as: UROXATRAL Take 10 mg by mouth at bedtime.   allopurinol 300 MG tablet Commonly known as: ZYLOPRIM Take 300 mg by mouth daily.   amLODipine 5 MG tablet Commonly known as: NORVASC Take 5 mg by mouth daily.   aspirin EC 325 MG tablet Take 325-650 mg by mouth every 8 (eight) hours as needed (for pain.).   aspirin EC 81 MG tablet Generic drug: aspirin EC Take 81 mg by mouth daily. Swallow whole.   atenolol 25 MG tablet Commonly known as: TENORMIN Take 25 mg by mouth daily.   benzonatate 200 MG capsule Commonly known  as: TESSALON Take 200 mg by mouth 3 (three) times daily as needed for cough.   CINNAMON PO Take 1,000 mg by mouth 2 (two) times daily.   cyclobenzaprine 10 MG tablet Commonly known as: FLEXERIL Take 10 mg by mouth 3 (three) times daily as needed for muscle spasms.   DULoxetine 30 MG capsule Commonly known as: CYMBALTA Take 30 mg by mouth daily.   furosemide 20 MG tablet Commonly known as: LASIX Take 20 mg by mouth daily.   indomethacin 50 MG capsule Commonly known as: INDOCIN Take 50 mg by mouth 3 (three) times daily as needed (FOR GOUT FLARES UP).   losartan 100 MG tablet Commonly known as: COZAAR Take 100 mg by mouth daily.   metFORMIN 500 MG tablet Commonly known as: GLUCOPHAGE Take 1 tablet (500 mg total) by mouth 2 (two) times daily with a meal.   methocarbamol 500 MG tablet Commonly known as: ROBAXIN Take 1 tablet (500 mg total) by mouth 2 (two) times daily as needed for muscle spasms.   methylPREDNISolone 4 MG Tbpk tablet Commonly known as: MEDROL DOSEPAK See admin instructions. (Typical regimens for 21 tablet dose packs of Methylprednisolone '4mg'$ , Prednisone '5mg'$ , and Prednisone '10mg'$ ) Day 1: 2 tabs before breakfast, 1 tab after lunch, 1 tab after supper, and 2 tabs at bedtime. Day 2: 1 tab before breakfast,  1 tab after lunch, 1 tab after supper, and 2 tabs at bedtime. Day 3: 1 tab before breakfast, 1 tab after lunch, 1 tab after supper, and 1 tab at bedtime. Day 4: 1 tab before breakfast, 1 tab after lunch, and 1 tab at bedtime. Day 5: 1 tab before breakfast and 1 tab at bedtime. Day 6: 1 tab before breakfast.   mirabegron ER 25 MG Tb24 tablet Commonly known as: MYRBETRIQ Take 1 tablet (25 mg total) by mouth daily.   naproxen 500 MG tablet Commonly known as: Naprosyn Take 1 tablet (500 mg total) by mouth 2 (two) times daily with a meal.   oxyCODONE-acetaminophen 5-325 MG tablet Commonly known as: PERCOCET/ROXICET Take 1 tablet by mouth every 6 (six) hours  as needed for severe pain.   oxyCODONE-acetaminophen 5-325 MG tablet Commonly known as: Percocet Take 1-2 tablets by mouth every 4 (four) hours as needed.   pregabalin 300 MG capsule Commonly known as: LYRICA Take 300 mg by mouth 2 (two) times daily.   rosuvastatin 5 MG tablet Commonly known as: CRESTOR Take 5 mg by mouth daily.   Rybelsus 7 MG Tabs Generic drug: Semaglutide Take 1 tablet by mouth daily.   silodosin 8 MG Caps capsule Commonly known as: RAPAFLO Take 1 capsule (8 mg total) by mouth at bedtime.   tamsulosin 0.4 MG Caps capsule Commonly known as: Flomax Take 1 capsule (0.4 mg total) by mouth daily.        Allergies: No Known Allergies  Family History: Family History  Problem Relation Age of Onset   Colon cancer Brother     Social History:  reports that he has never smoked. He has never been exposed to tobacco smoke. He has never used smokeless tobacco. He reports current alcohol use. He reports that he does not use drugs.  ROS: All other review of systems were reviewed and are negative except what is noted above in HPI  Physical Exam: BP 129/77   Pulse (!) 103   Constitutional:  Alert and oriented, No acute distress. HEENT: [place] AT, moist mucus membranes.  Trachea midline, no masses. Cardiovascular: No clubbing, cyanosis, or edema. Respiratory: Normal respiratory effort, no increased work of breathing. GI: Abdomen is soft, nontender, nondistended, no abdominal masses GU: No CVA tenderness.  Lymph: No cervical or inguinal lymphadenopathy. Skin: No rashes, bruises or suspicious lesions. Neurologic: Grossly intact, no focal deficits, moving all 4 extremities. Psychiatric: Normal mood and affect.  Laboratory Data: Lab Results  Component Value Date   WBC 16.8 (H) 03/04/2022   HGB 16.7 03/04/2022   HCT 48.7 03/04/2022   MCV 88.7 03/04/2022   PLT 203 03/04/2022    Lab Results  Component Value Date   CREATININE 0.91 03/04/2022    No results  found for: "PSA"  No results found for: "TESTOSTERONE"  No results found for: "HGBA1C"  Urinalysis    Component Value Date/Time   COLORURINE YELLOW 10/05/2014 1607   APPEARANCEUR Cloudy (A) 02/18/2022 1444   LABSPEC 1.021 10/05/2014 1607   PHURINE 5.0 10/05/2014 1607   GLUCOSEU 1+ (A) 02/18/2022 1444   HGBUR NEGATIVE 10/05/2014 1607   BILIRUBINUR Negative 02/18/2022 1444   KETONESUR trace (5) (A) 07/04/2021 1336   KETONESUR NEGATIVE 10/05/2014 1607   PROTEINUR 1+ (A) 02/18/2022 1444   PROTEINUR NEGATIVE 10/05/2014 1607   UROBILINOGEN 1.0 07/04/2021 1336   UROBILINOGEN 0.2 10/05/2014 1607   NITRITE Negative 02/18/2022 1444   NITRITE NEGATIVE 10/05/2014 1607   LEUKOCYTESUR 1+ (A) 02/18/2022  1444    Lab Results  Component Value Date   LABMICR See below: 02/18/2022   WBCUA 0-5 02/18/2022   LABEPIT 0-10 02/18/2022   MUCUS Present 11/29/2021   BACTERIA Few 02/18/2022    Pertinent Imaging:  No results found for this or any previous visit.  No results found for this or any previous visit.  No results found for this or any previous visit.  No results found for this or any previous visit.  Results for orders placed during the hospital encounter of 07/22/17  US RENAL  Narrative CLINICAL DATA:  Chronic kidney disease stage 3, obesity  EXAM: RENAL / URINARY TRACT ULTRASOUND COMPLETE  COMPARISON:  CT abdomen pelvis of 10/05/2014  FINDINGS: Right Kidney:  Length: 13.3 cm. No hydronephrosis is seen. The renal parenchymal echogenicity is within normal limits. There is and echogenic focus in the mid upper right kidney of 8 mm in diameter consistent with nonobstructing calculus.  Left Kidney:  Length: 12.6 cm. No hydronephrosis is seen. The parenchyma is normal in echogenicity. No renal calculi are noted.  Bladder:  The urinary bladder is not distended.  Incidentally the echogenicity of the liver appears somewhat inhomogeneous and echogenic which may indicate  hepatic steatosis.  IMPRESSION: 1. No hydronephrosis. 2. 8 mm nonobstructing right mid upper renal calculus. 3. Incidental inhomogeneous echogenic liver parenchyma suggesting hepatic steatosis.   Electronically Signed By: Ivar Drape M.D. On: 07/22/2017 16:11  No valid procedures specified. No results found for this or any previous visit.  Results for orders placed in visit on 11/16/21  CT RENAL STONE STUDY  Narrative CLINICAL DATA:  Nephrolithiasis.  EXAM: CT ABDOMEN AND PELVIS WITHOUT CONTRAST  TECHNIQUE: Multidetector CT imaging of the abdomen and pelvis was performed following the standard protocol without IV contrast.  RADIATION DOSE REDUCTION: This exam was performed according to the departmental dose-optimization program which includes automated exposure control, adjustment of the mA and/or kV according to patient size and/or use of iterative reconstruction technique.  COMPARISON:  10/05/2014  FINDINGS: Lower chest: No acute findings.  Hepatobiliary: Moderate diffuse hepatic steatosis is again demonstrated. No mass visualized on this unenhanced exam. Gallbladder is unremarkable. No evidence of biliary ductal dilatation.  Pancreas: No mass or inflammatory process visualized on this unenhanced exam.  Spleen:  Within normal limits in size.  Adrenals/Urinary tract: No evidence of urolithiasis or hydronephrosis. Mild diffuse bladder wall thickening, which may be seen with cystitis or chronic bladder outlet obstruction.  Stomach/Bowel: No evidence of obstruction, inflammatory process, or abnormal fluid collections. Normal appendix visualized.  Vascular/Lymphatic: No pathologically enlarged lymph nodes identified. No evidence of abdominal aortic aneurysm. Aortic atherosclerotic calcification incidentally noted.  Reproductive:  No mass or other significant abnormality.  Other:  None.  Musculoskeletal:  No suspicious bone lesions  identified.  IMPRESSION: No evidence of urolithiasis or hydronephrosis.  Mild diffuse bladder wall thickening, which may be seen with cystitis or chronic bladder outlet obstruction.  Stable moderate hepatic steatosis.   Electronically Signed By: Marlaine Hind M.D. On: 12/10/2021 13:47   Assessment & Plan:    1. Benign prostatic hyperplasia with urinary obstruction -followup 1 month with flow PVR - Bladder Voiding Trial  2. Urine retention -rapaflo '8mg'$  daily  3. Urethral stricture -followup 4-6 weeks with flow PVR   No follow-ups on file.  [name], MD  [facility] [place]

## 2022-04-03 NOTE — Progress Notes (Signed)
Fill and Pull Catheter Removal  Patient is present today for a catheter removal.  Patient was cleaned and prepped in a sterile fashion 519m of sterile water/ saline was instilled into the bladder when the patient felt the urge to urinate. 15mof water was then drained from the balloon.  A 18FR foley cath was removed from the bladder no complications were noted .  Patient as then given some time to void on their own.  Patient can void  [id number] their own after some time.  Patient tolerated well.  Performed by: Coden Franchi LPN  Follow up/ Additional notes: Per MD

## 2022-04-09 ENCOUNTER — Encounter: Payer: Self-pay | Admitting: Urology

## 2022-04-09 NOTE — Patient Instructions (Signed)
Hiperplasia prosttica benigna Benign Prostatic Hyperplasia  La hiperplasia prosttica benigna (HBP) es un aumento del tamao de la prstata que es causado por el proceso normal de envejecimiento. La prstata puede aumentar de tamao a medida que un hombre envejece. Esta afeccin no es causada por el cncer. La prstata es una glndula del tamao de una nuez que participa en la produccin de semen. Est ubicada frente al recto y debajo de la vejiga. La vejiga almacena la orina. La uretra lleva la orina hacia fuera del cuerpo. Una prstata agrandada puede presionar la uretra. Esto puede dificultar el pasaje de la orina. La acumulacin de orina en la vejiga puede causar una infeccin. La presin y la infeccin pueden provocar daos en la vejiga y una insuficiencia en los riones (renal). Cules son las causas? Esta afeccin es parte del proceso normal de envejecimiento. Sin embargo, no todos los hombres desarrollan problemas por esta afeccin. Si la prstata se agranda lejos de la uretra, el flujo de orina no se obstruir. Si se agranda hacia la uretra y la comprime, habr problemas con el paso de la orina. Qu incrementa el riesgo? Es ms probable que esta afeccin se desarrolle en los hombres mayores de 50 aos. Cules son los signos o sntomas? Los sntomas de esta afeccin incluyen: Levantarse con frecuencia durante la noche para orinar. Necesidad de orinar con ms frecuencia durante el da. Dificultad para comenzar a eliminar la orina. Disminucin del tamao y de la fuerza del chorro de orina. Prdida (goteo) luego de orinar. Imposibilidad para orinar. En este caso, es necesario un tratamiento inmediato. Imposibilidad para vaciar la vejiga completamente. Dolor al orinar. Esto es ms comn si tambin hay una infeccin. Infeccin de las vas urinarias (IU). Cmo se diagnostica? Esta afeccin se diagnostica en funcin de los antecedentes mdicos, un examen fsico y los sntomas. Tambin se  le realizarn pruebas, por ejemplo: Un estudio despus de vaciar la vejiga. Este mide la cantidad de orina que queda en la vejiga despus de terminar de orinar. Examen rectal digital. En un examen rectal, el mdico controla la prstata colocando un dedo lubricado y enguantado en el recto para sentir la parte posterior de la prstata. Este examen detecta el tamao de la glndula y si hay algn bulto o crecimiento anormal. Anlisis de orina (urinlisis). Estudio de antgeno prosttico especfico (PSA). Este es un anlisis de sangre que se utiliza para diagnosticar el cncer de prstata. Una ecografa. En este estudio, se utilizan ondas de sonido para producir de manera electrnica una imagen de la prstata. El mdico puede derivarlo a un especialista en enfermedades del rin y de la prstata (urlogo). Cmo se trata? Una vez que los sntomas comienzan, el mdico controlar la afeccin (vigilancia activa u observacin cautelosa). El tratamiento depender de la gravedad de la afeccin. El tratamiento puede incluir: Observacin y exmenes anuales. Este puede ser el nico tratamiento necesario si la afeccin y los sntomas son leves. Medicamentos para aliviar los sntomas, entre los que se incluyen los siguientes: Medicamentos para achicar la prstata. Medicamentos para relajar el msculo de la prstata. Ciruga, solo cuando los casos son graves. La ciruga puede incluir lo siguiente: Prostatectoma. En este procedimiento, se extrae el tejido de la prstata completamente, a travs de una incisin abierta, con un laparoscopio o con robtica. Reseccin transuretral de la prstata (RTUP). En este procedimiento, se inserta una herramienta a travs de la abertura en la punta del pene (uretra). Se utiliza para cortar tejido del centro interior de la   prstata. Los trozos se retiran a travs de la misma abertura del pene. De este modo, se libera la obstruccin. Incisin transuretral (ITUP). En este  procedimiento, se hacen pequeos cortes en la prstata. Esto alivia la presin de la prstata sobre la uretra. Termoterapia transuretral con microondas (TTUM). En este procedimiento, se utilizan microondas para generar calor. El calor destruye y extirpa una pequea cantidad de tejido prosttico. Ablacin transuretral con aguja (ATUA). En este procedimiento, se utiliza la radiofrecuencia para destruir y extirpar una pequea cantidad de tejido prosttico. Coagulacin intersticial con lser (CIL). En este procedimiento, se utiliza un lser para destruir y extirpar una pequea cantidad de tejido prosttico. Electrovaporizacin transuretral (EVTU). En este procedimiento, se utilizan electrodos para destruir y extirpar una pequea cantidad de tejido prosttico. Liberacin uretral prosttica. En este procedimiento, se inserta un implante para ejercer presin en los lbulos de la prstata, en direccin contraria a la uretra. Siga estas instrucciones en su casa: Use los medicamentos de venta libre y los recetados solamente como se lo haya indicado el mdico. Controle si hay cambios en los sntomas. Hable con su mdico antes de hacer cualquier cambio. Evite beber grandes cantidades de lquido antes de irse a la cama o de salir de su casa. Evite o reduzca la cantidad de cafena o alcohol que consume. Tmese tiempo para orinar. Concurra a todas las visitas de seguimiento. Esto es importante. Comunquese con un mdico si: Siente dolor en la espalda sin explicacin. Los sntomas no mejoran con el tratamiento. Los medicamentos le causan efectos secundarios. La orina se vuelve muy oscura o tiene mal olor. Siente que la parte inferior del abdomen est distendida y tiene problemas para orinar. Solicite ayuda de inmediato si: Tiene fiebre o escalofros. De repente no puede orinar. Se siente aturdido o muy mareado, o se desmaya. Observa gran cantidad de sangre o cogulos en la orina. Sus problemas urinarios se  vuelven difciles de controlar. Siente dolor moderado a intenso en la espalda o en la fosa lumbar. La fosa lumbar es el costado del cuerpo entre las costillas y la cadera. Estos sntomas pueden indicar una emergencia. Solicite ayuda de inmediato. Llame al 911. No espere a ver si los sntomas desaparecen. No conduzca por sus propios medios hasta el hospital. Resumen La hiperplasia prosttica benigna (HBP) es un aumento del tamao de la prstata que es causado por el proceso normal de envejecimiento. No es causada por el cncer. Una prstata agrandada puede presionar la uretra. Esto puede dificultar el paso de la orina. Es ms probable que esta afeccin se desarrolle en los hombres mayores de 50 aos. Obtenga atencin de inmediato si de repente no puede orinar. Esta informacin no tiene como fin reemplazar el consejo del mdico. Asegrese de hacerle al mdico cualquier pregunta que tenga. Document Revised: 10/17/2020 Document Reviewed: 10/17/2020 Elsevier Patient Education  2023 Elsevier Inc.  

## 2022-05-15 ENCOUNTER — Ambulatory Visit: Payer: PRIVATE HEALTH INSURANCE | Admitting: Urology

## 2022-05-15 VITALS — BP 148/85 | HR 81

## 2022-05-15 DIAGNOSIS — R339 Retention of urine, unspecified: Secondary | ICD-10-CM | POA: Diagnosis not present

## 2022-05-15 DIAGNOSIS — R350 Frequency of micturition: Secondary | ICD-10-CM

## 2022-05-15 DIAGNOSIS — N138 Other obstructive and reflux uropathy: Secondary | ICD-10-CM | POA: Diagnosis not present

## 2022-05-15 DIAGNOSIS — N401 Enlarged prostate with lower urinary tract symptoms: Secondary | ICD-10-CM | POA: Diagnosis not present

## 2022-05-15 LAB — BLADDER SCAN AMB NON-IMAGING: Scan Result: 36

## 2022-05-15 MED ORDER — SILODOSIN 8 MG PO CAPS: 8.0000 mg | ORAL_CAPSULE | Freq: Every day | ORAL | 11 refills | Status: DC

## 2022-05-15 NOTE — Progress Notes (Unsigned)
post void residual=36

## 2022-05-15 NOTE — Progress Notes (Unsigned)
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 He notes occasional splaying of his urinary stream. No dysuria. IPSS 7 QOL 2. No straining to urinate. No urinary hesitancy. He has an intermittent weak stream.    PMH: Past Medical History:  Diagnosis Date   CKD (chronic kidney disease) 01/08/2017   patient states he does not have.   Diabetes mellitus without complication ([place])    Essential hypertension, benign 01/08/2017   Gout    High cholesterol 01/08/2017   Hypertension    Obesity     Surgical History: Past Surgical History:  Procedure Laterality Date   CIRCUMCISION     COLONOSCOPY N/A 01/14/2017   Procedure: COLONOSCOPY;  Surgeon: Rogene Houston, MD;  Location: [facility];  Service: Endoscopy;  Laterality: N/A;   CYSTOSCOPY WITH URETHRAL DILATATION N/A 03/28/2022   Procedure: CYSTOSCOPY WITH URETHRAL DILATATION;  Surgeon: Cleon Gustin, MD;  Location: [facility];  Service: Urology;  Laterality: N/A;   DENTAL SURGERY     teeth removed    Home Medications:  Allergies as of 05/15/2022   No Known Allergies      Medication List        Accurate as of May 15, 2022  9:31 AM. If you have any questions, ask your nurse or doctor.          allopurinol 300 MG tablet Commonly known as: ZYLOPRIM Take 300 mg by mouth daily.   amLODipine 5 MG tablet Commonly known as: NORVASC Take 5 mg by mouth daily.   aspirin EC 325 MG tablet Take 325-650 mg by mouth every 8 (eight) hours as needed (for pain.).   aspirin EC 81 MG tablet Generic drug: aspirin EC Take 81 mg by mouth daily. Swallow whole.   atenolol 25 MG tablet Commonly known as: TENORMIN Take 25 mg by mouth daily.   benzonatate 200 MG capsule Commonly known as: TESSALON Take  200 mg by mouth 3 (three) times daily as needed for cough.   CINNAMON PO Take 1,000 mg by mouth 2 (two) times daily.   cyclobenzaprine 10 MG tablet Commonly known as: FLEXERIL Take 10 mg by mouth 3 (three) times daily as needed for muscle spasms.   DULoxetine 30 MG capsule Commonly known as: CYMBALTA Take 30 mg by mouth daily.   furosemide 20 MG tablet Commonly known as: LASIX Take 20 mg by mouth daily.   indomethacin 50 MG capsule Commonly known as: INDOCIN Take 50 mg by mouth 3 (three) times daily as needed (FOR GOUT FLARES UP).   losartan 100 MG tablet Commonly known as: COZAAR Take 100 mg by mouth daily.   metFORMIN 500 MG tablet Commonly known as: GLUCOPHAGE Take 1 tablet (500 mg total) by mouth 2 (two) times daily with a meal.   methocarbamol 500 MG tablet Commonly known as: ROBAXIN Take 1 tablet (500 mg total) by mouth 2 (two) times daily as needed for muscle spasms.   methylPREDNISolone 4 MG Tbpk tablet Commonly known as: MEDROL DOSEPAK See admin instructions. (Typical regimens for 21 tablet dose packs of Methylprednisolone 17m, Prednisone 570m and Prednisone 1024mDay 1: 2 tabs before breakfast, 1 tab after lunch, 1 tab after supper, and 2 tabs at bedtime. Day 2: 1 tab before breakfast, 1 tab  after lunch, 1 tab after supper, and 2 tabs at bedtime. Day 3: 1 tab before breakfast, 1 tab after lunch, 1 tab after supper, and 1 tab at bedtime. Day 4: 1 tab before breakfast, 1 tab after lunch, and 1 tab at bedtime. Day 5: 1 tab before breakfast and 1 tab at bedtime. Day 6: 1 tab before breakfast.   mirabegron ER 25 MG Tb24 tablet Commonly known as: MYRBETRIQ Take 1 tablet (25 mg total) by mouth daily.   naproxen 500 MG tablet Commonly known as: Naprosyn Take 1 tablet (500 mg total) by mouth 2 (two) times daily with a meal.   oxyCODONE-acetaminophen 5-325 MG tablet Commonly known as: PERCOCET/ROXICET Take 1 tablet by mouth every 6 (six) hours as needed for severe  pain.   oxyCODONE-acetaminophen 5-325 MG tablet Commonly known as: Percocet Take 1-2 tablets by mouth every 4 (four) hours as needed.   pregabalin 300 MG capsule Commonly known as: LYRICA Take 300 mg by mouth 2 (two) times daily.   rosuvastatin 5 MG tablet Commonly known as: CRESTOR Take 5 mg by mouth daily.   Rybelsus 7 MG Tabs Generic drug: Semaglutide Take 1 tablet by mouth daily.   silodosin 8 MG Caps capsule Commonly known as: RAPAFLO Take 1 capsule (8 mg total) by mouth at bedtime.        Allergies: No Known Allergies  Family History: Family History  Problem Relation Age of Onset   Colon cancer Brother     Social History:  reports that he has never smoked. He has never been exposed to tobacco smoke. He has never used smokeless tobacco. He reports current alcohol use. He reports that he does not use drugs.  ROS: All other review of systems were reviewed and are negative except what is noted above in HPI  Physical Exam: BP (!) 148/85   Pulse 81   Constitutional:  Alert and oriented, No acute distress. HEENT: [place] AT, moist mucus membranes.  Trachea midline, no masses. Cardiovascular: No clubbing, cyanosis, or edema. Respiratory: Normal respiratory effort, no increased work of breathing. GI: Abdomen is soft, nontender, nondistended, no abdominal masses GU: No CVA tenderness.  Lymph: No cervical or inguinal lymphadenopathy. Skin: No rashes, bruises or suspicious lesions. Neurologic: Grossly intact, no focal deficits, moving all 4 extremities. Psychiatric: Normal mood and affect.  Laboratory Data: Lab Results  Component Value Date   WBC 16.8 (H) 03/04/2022   HGB 16.7 03/04/2022   HCT 48.7 03/04/2022   MCV 88.7 03/04/2022   PLT 203 03/04/2022    Lab Results  Component Value Date   CREATININE 0.91 03/04/2022    No results found for: "PSA"  No results found for: "TESTOSTERONE"  No results found for: "HGBA1C"  Urinalysis    Component Value  Date/Time   COLORURINE YELLOW 10/05/2014 1607   APPEARANCEUR Cloudy (A) 02/18/2022 1444   LABSPEC 1.021 10/05/2014 1607   PHURINE 5.0 10/05/2014 1607   GLUCOSEU 1+ (A) 02/18/2022 1444   HGBUR NEGATIVE 10/05/2014 1607   BILIRUBINUR Negative 02/18/2022 1444   KETONESUR trace (5) (A) 07/04/2021 1336   KETONESUR NEGATIVE 10/05/2014 1607   PROTEINUR 1+ (A) 02/18/2022 1444   PROTEINUR NEGATIVE 10/05/2014 1607   UROBILINOGEN 1.0 07/04/2021 1336   UROBILINOGEN 0.2 10/05/2014 1607   NITRITE Negative 02/18/2022 1444   NITRITE NEGATIVE 10/05/2014 1607   LEUKOCYTESUR 1+ (A) 02/18/2022 1444    Lab Results  Component Value Date   LABMICR See below: 02/18/2022   WBCUA 0-5 02/18/2022  LABEPIT 0-10 02/18/2022   MUCUS Present 11/29/2021   BACTERIA Few 02/18/2022    Pertinent Imaging:  No results found for this or any previous visit.  No results found for this or any previous visit.  No results found for this or any previous visit.  No results found for this or any previous visit.  Results for orders placed during the hospital encounter of 07/22/17  US RENAL  Narrative CLINICAL DATA:  Chronic kidney disease stage 3, obesity  EXAM: RENAL / URINARY TRACT ULTRASOUND COMPLETE  COMPARISON:  CT abdomen pelvis of 10/05/2014  FINDINGS: Right Kidney:  Length: 13.3 cm. No hydronephrosis is seen. The renal parenchymal echogenicity is within normal limits. There is and echogenic focus in the mid upper right kidney of 8 mm in diameter consistent with nonobstructing calculus.  Left Kidney:  Length: 12.6 cm. No hydronephrosis is seen. The parenchyma is normal in echogenicity. No renal calculi are noted.  Bladder:  The urinary bladder is not distended.  Incidentally the echogenicity of the liver appears somewhat inhomogeneous and echogenic which may indicate hepatic steatosis.  IMPRESSION: 1. No hydronephrosis. 2. 8 mm nonobstructing right mid upper renal calculus. 3.  Incidental inhomogeneous echogenic liver parenchyma suggesting hepatic steatosis.   Electronically Signed By: Ivar Drape M.D. On: 07/22/2017 16:11  No valid procedures specified. No results found for this or any previous visit.  Results for orders placed in visit on 11/16/21  CT RENAL STONE STUDY  Narrative CLINICAL DATA:  Nephrolithiasis.  EXAM: CT ABDOMEN AND PELVIS WITHOUT CONTRAST  TECHNIQUE: Multidetector CT imaging of the abdomen and pelvis was performed following the standard protocol without IV contrast.  RADIATION DOSE REDUCTION: This exam was performed according to the departmental dose-optimization program which includes automated exposure control, adjustment of the mA and/or kV according to patient size and/or use of iterative reconstruction technique.  COMPARISON:  10/05/2014  FINDINGS: Lower chest: No acute findings.  Hepatobiliary: Moderate diffuse hepatic steatosis is again demonstrated. No mass visualized on this unenhanced exam. Gallbladder is unremarkable. No evidence of biliary ductal dilatation.  Pancreas: No mass or inflammatory process visualized on this unenhanced exam.  Spleen:  Within normal limits in size.  Adrenals/Urinary tract: No evidence of urolithiasis or hydronephrosis. Mild diffuse bladder wall thickening, which may be seen with cystitis or chronic bladder outlet obstruction.  Stomach/Bowel: No evidence of obstruction, inflammatory process, or abnormal fluid collections. Normal appendix visualized.  Vascular/Lymphatic: No pathologically enlarged lymph nodes identified. No evidence of abdominal aortic aneurysm. Aortic atherosclerotic calcification incidentally noted.  Reproductive:  No mass or other significant abnormality.  Other:  None.  Musculoskeletal:  No suspicious bone lesions identified.  IMPRESSION: No evidence of urolithiasis or hydronephrosis.  Mild diffuse bladder wall thickening, which may be seen  with cystitis or chronic bladder outlet obstruction.  Stable moderate hepatic steatosis.   Electronically Signed By: Marlaine Hind M.D. On: 12/10/2021 13:47   Assessment & Plan:    1. Urine retention -rapaflo 2m daily - BLADDER SCAN AMB NON-IMAGING  2. Benign prostatic hyperplasia with urinary obstruction -rapaflo 881mdaily  3. Urinary frequency Rapaflo 58m458maily   No follow-ups on file.  PatNicolette BangD  [facility] [place]

## 2022-05-16 ENCOUNTER — Encounter: Payer: Self-pay | Admitting: Urology

## 2022-05-16 NOTE — Patient Instructions (Signed)
Benign Prostatic Hyperplasia ? ?Benign prostatic hyperplasia (BPH) is an enlarged prostate gland that is caused by the normal aging process. The prostate may get bigger as a man gets older. The condition is not caused by cancer. The prostate is a walnut-sized gland that is involved in the production of semen. It is located in front of the rectum and below the bladder. The bladder stores urine. The urethra carries stored urine out of the body. ?An enlarged prostate can press on the urethra. This can make it harder to pass urine. The buildup of urine in the bladder can cause infection. Back pressure and infection may progress to bladder damage and kidney (renal) failure. ?What are the causes? ?This condition is part of the normal aging process. However, not all men develop problems from this condition. If the prostate enlarges away from the urethra, urine flow will not be blocked. If it enlarges toward the urethra and compresses it, there will be problems passing urine. ?What increases the risk? ?This condition is more likely to develop in men older than 50 years. ?What are the signs or symptoms? ?Symptoms of this condition include: ?Getting up often during the night to urinate. ?Needing to urinate frequently during the day. ?Difficulty starting urine flow. ?Decrease in size and strength of your urine stream. ?Leaking (dribbling) after urinating. ?Inability to pass urine. This needs immediate treatment. ?Inability to completely empty your bladder. ?Pain when you pass urine. This is more common if there is also an infection. ?Urinary tract infection (UTI). ?How is this diagnosed? ?This condition is diagnosed based on your medical history, a physical exam, and your symptoms. Tests will also be done, such as: ?A post-void bladder scan. This measures any amount of urine that may remain in your bladder after you finish urinating. ?A digital rectal exam. In a rectal exam, your health care provider checks your prostate by  putting a lubricated, gloved finger into your rectum to feel the back of your prostate gland. This exam detects the size of your gland and any abnormal lumps or growths. ?An exam of your urine (urinalysis). ?A prostate specific antigen (PSA) screening. This is a blood test used to screen for prostate cancer. ?An ultrasound. This test uses sound waves to electronically produce a picture of your prostate gland. ?Your health care provider may refer you to a specialist in kidney and prostate diseases (urologist). ?How is this treated? ?Once symptoms begin, your health care provider will monitor your condition (active surveillance or watchful waiting). Treatment for this condition will depend on the severity of your condition. Treatment may include: ?Observation and yearly exams. This may be the only treatment needed if your condition and symptoms are mild. ?Medicines to relieve your symptoms, including: ?Medicines to shrink the prostate. ?Medicines to relax the muscle of the prostate. ?Surgery in severe cases. Surgery may include: ?Prostatectomy. In this procedure, the prostate tissue is removed completely through an open incision or with a laparoscope or robotics. ?Transurethral resection of the prostate (TURP). In this procedure, a tool is inserted through the opening at the tip of the penis (urethra). It is used to cut away tissue of the inner core of the prostate. The pieces are removed through the same opening of the penis. This removes the blockage. ?Transurethral incision (TUIP). In this procedure, small cuts are made in the prostate. This lessens the prostate's pressure on the urethra. ?Transurethral microwave thermotherapy (TUMT). This procedure uses microwaves to create heat. The heat destroys and removes a small amount of   prostate tissue. ?Transurethral needle ablation (TUNA). This procedure uses radio frequencies to destroy and remove a small amount of prostate tissue. ?Interstitial laser coagulation (ILC).  This procedure uses a laser to destroy and remove a small amount of prostate tissue. ?Transurethral electrovaporization (TUVP). This procedure uses electrodes to destroy and remove a small amount of prostate tissue. ?Prostatic urethral lift. This procedure inserts an implant to push the lobes of the prostate away from the urethra. ?Follow these instructions at home: ?Take over-the-counter and prescription medicines only as told by your health care provider. ?Monitor your symptoms for any changes. Contact your health care provider with any changes. ?Avoid drinking large amounts of liquid before going to bed or out in public. ?Avoid or reduce how much caffeine or alcohol you drink. ?Give yourself time when you urinate. ?Keep all follow-up visits. This is important. ?Contact a health care provider if: ?You have unexplained back pain. ?Your symptoms do not get better with treatment. ?You develop side effects from the medicine you are taking. ?Your urine becomes very dark or has a bad smell. ?Your lower abdomen becomes distended and you have trouble passing urine. ?Get help right away if: ?You have a fever or chills. ?You suddenly cannot urinate. ?You feel light-headed or very dizzy, or you faint. ?There are large amounts of blood or clots in your urine. ?Your urinary problems become hard to manage. ?You develop moderate to severe low back or flank pain. The flank is the side of your body between the ribs and the hip. ?These symptoms may be an emergency. Get help right away. Call 911. ?Do not wait to see if the symptoms will go away. ?Do not drive yourself to the hospital. ?Summary ?Benign prostatic hyperplasia (BPH) is an enlarged prostate that is caused by the normal aging process. It is not caused by cancer. ?An enlarged prostate can press on the urethra. This can make it hard to pass urine. ?This condition is more likely to develop in men older than 50 years. ?Get help right away if you suddenly cannot urinate. ?This  information is not intended to replace advice given to you by your health care provider. Make sure you discuss any questions you have with your health care provider. ?Document Revised: 09/27/2020 Document Reviewed: 09/27/2020 ?Elsevier Patient Education ? 2023 [employer] ? ?

## 2022-08-16 ENCOUNTER — Ambulatory Visit: Payer: Self-pay

## 2022-08-16 ENCOUNTER — Encounter: Payer: Self-pay | Admitting: Orthopedic Surgery

## 2022-08-16 ENCOUNTER — Ambulatory Visit (INDEPENDENT_AMBULATORY_CARE_PROVIDER_SITE_OTHER): Payer: PRIVATE HEALTH INSURANCE

## 2022-08-16 ENCOUNTER — Encounter: Payer: Self-pay | Admitting: Urology

## 2022-08-16 ENCOUNTER — Ambulatory Visit: Payer: PRIVATE HEALTH INSURANCE | Admitting: Orthopedic Surgery

## 2022-08-16 ENCOUNTER — Ambulatory Visit (INDEPENDENT_AMBULATORY_CARE_PROVIDER_SITE_OTHER): Payer: PRIVATE HEALTH INSURANCE | Admitting: Urology

## 2022-08-16 VITALS — BP 131/71 | HR 84 | Ht 74.0 in | Wt 310.0 lb

## 2022-08-16 VITALS — BP 146/76 | HR 69

## 2022-08-16 DIAGNOSIS — M48062 Spinal stenosis, lumbar region with neurogenic claudication: Secondary | ICD-10-CM

## 2022-08-16 DIAGNOSIS — G8929 Other chronic pain: Secondary | ICD-10-CM

## 2022-08-16 DIAGNOSIS — R339 Retention of urine, unspecified: Secondary | ICD-10-CM

## 2022-08-16 DIAGNOSIS — M1712 Unilateral primary osteoarthritis, left knee: Secondary | ICD-10-CM

## 2022-08-16 DIAGNOSIS — R29898 Other symptoms and signs involving the musculoskeletal system: Secondary | ICD-10-CM

## 2022-08-16 DIAGNOSIS — M170 Bilateral primary osteoarthritis of knee: Secondary | ICD-10-CM

## 2022-08-16 DIAGNOSIS — N35011 Post-traumatic bulbous urethral stricture: Secondary | ICD-10-CM | POA: Diagnosis not present

## 2022-08-16 DIAGNOSIS — N401 Enlarged prostate with lower urinary tract symptoms: Secondary | ICD-10-CM | POA: Diagnosis not present

## 2022-08-16 DIAGNOSIS — M1711 Unilateral primary osteoarthritis, right knee: Secondary | ICD-10-CM

## 2022-08-16 DIAGNOSIS — N138 Other obstructive and reflux uropathy: Secondary | ICD-10-CM

## 2022-08-16 MED ORDER — TAMSULOSIN HCL 0.4 MG PO CAPS
0.4000 mg | ORAL_CAPSULE | Freq: Every day | ORAL | 3 refills | Status: DC
Start: 2022-08-16 — End: 2023-04-16

## 2022-08-16 MED ORDER — MELOXICAM 7.5 MG PO TABS
7.5000 mg | ORAL_TABLET | Freq: Every day | ORAL | 5 refills | Status: DC
Start: 2022-08-16 — End: 2023-02-18

## 2022-08-16 NOTE — Progress Notes (Unsigned)
08/16/2022 12:30 PM   Juliann Pulse Apr 21, 1955 [id number]  Referring provider: Kirstie Peri, MD [address] [place],  [place] [address]  Chief Complaint  Patient presents with   flow rate    HPI: Mr Manuel [name] is a 20JY here for followup for BPh and urethral stricture. IPSS 5 QOL 2 on flomax 0.4mg  daily. Urine stream strong. His urinary frequency is every 4-5 hours. No straining to uriante. No dysuira or hematuria. No other complaints today   PMH: Past Medical History:  Diagnosis Date   CKD (chronic kidney disease) 01/08/2017   patient states he does not have.   Diabetes mellitus without complication (HCC)    Essential hypertension, benign 01/08/2017   Gout    High cholesterol 01/08/2017   Hypertension    Obesity     Surgical History: Past Surgical History:  Procedure Laterality Date   CIRCUMCISION     COLONOSCOPY N/A 01/14/2017   Procedure: COLONOSCOPY;  Surgeon: Malissa Hippo, MD;  Location: [facility];  Service: Endoscopy;  Laterality: N/A;   CYSTOSCOPY WITH URETHRAL DILATATION N/A 03/28/2022   Procedure: CYSTOSCOPY WITH URETHRAL DILATATION;  Surgeon: Malen Gauze, MD;  Location: [facility];  Service: Urology;  Laterality: N/A;   DENTAL SURGERY     teeth removed    Home Medications:  Allergies as of 08/16/2022   No Known Allergies      Medication List        Accurate as of Aug 16, 2022 12:30 PM. If you have any questions, ask your nurse or doctor.          STOP taking these medications    benzonatate 200 MG capsule Commonly known as: TESSALON Stopped by: Fuller Canada, MD   CINNAMON PO Stopped by: Fuller Canada, MD   cyclobenzaprine 10 MG tablet Commonly known as: FLEXERIL Stopped by: Fuller Canada, MD   furosemide 20 MG tablet Commonly known as: LASIX Stopped by: Wilkie Aye, MD   indomethacin 50 MG capsule Commonly known as: INDOCIN Stopped by: Wilkie Aye, MD   methocarbamol 500 MG tablet Commonly known as:  ROBAXIN Stopped by: Fuller Canada, MD   methylPREDNISolone 4 MG Tbpk tablet Commonly known as: MEDROL DOSEPAK Stopped by: Fuller Canada, MD   naproxen 500 MG tablet Commonly known as: Naprosyn Stopped by: Fuller Canada, MD   oxyCODONE-acetaminophen 5-325 MG tablet Commonly known as: Percocet Stopped by: Fuller Canada, MD   tamsulosin 0.4 MG Caps capsule Commonly known as: FLOMAX Stopped by: Fuller Canada, MD       TAKE these medications    allopurinol 300 MG tablet Commonly known as: ZYLOPRIM Take 300 mg by mouth daily.   amLODipine 5 MG tablet Commonly known as: NORVASC Take 5 mg by mouth daily.   aspirin EC 81 MG tablet Generic drug: aspirin EC Take 81 mg by mouth daily. Swallow whole. What changed: Another medication with the same name was removed. Continue taking this medication, and follow the directions you see here. Changed by: Fuller Canada, MD   atenolol 25 MG tablet Commonly known as: TENORMIN Take 25 mg by mouth daily.   DULoxetine 30 MG capsule Commonly known as: CYMBALTA Take 30 mg by mouth daily.   losartan 100 MG tablet Commonly known as: COZAAR Take 100 mg by mouth daily.   meloxicam 7.5 MG tablet Commonly known as: Mobic Take 1 tablet (7.5 mg total) by mouth daily. Started by: Fuller Canada, MD   metFORMIN 500 MG tablet Commonly known as: GLUCOPHAGE Take 1  tablet (500 mg total) by mouth 2 (two) times daily with a meal.   pregabalin 300 MG capsule Commonly known as: LYRICA Take 300 mg by mouth 2 (two) times daily.   rosuvastatin 5 MG tablet Commonly known as: CRESTOR Take 5 mg by mouth daily.   Rybelsus 7 MG Tabs Generic drug: Semaglutide Take 1 tablet by mouth daily.   silodosin 8 MG Caps capsule Commonly known as: RAPAFLO Take 1 capsule (8 mg total) by mouth at bedtime.        Allergies: No Known Allergies  Family History: Family History  Problem Relation Age of Onset   Colon cancer Brother      Social History:  reports that he has never smoked. He has never been exposed to tobacco smoke. He has never used smokeless tobacco. He reports current alcohol use. He reports that he does not use drugs.  ROS: All other review of systems were reviewed and are negative except what is noted above in HPI  Physical Exam: BP (!) 146/76   Pulse 69   Constitutional:  Alert and oriented, No acute distress. HEENT: [place] AT, moist mucus membranes.  Trachea midline, no masses. Cardiovascular: No clubbing, cyanosis, or edema. Respiratory: Normal respiratory effort, no increased work of breathing. GI: Abdomen is soft, nontender, nondistended, no abdominal masses GU: No CVA tenderness.  Lymph: No cervical or inguinal lymphadenopathy. Skin: No rashes, bruises or suspicious lesions. Neurologic: Grossly intact, no focal deficits, moving all 4 extremities. Psychiatric: Normal mood and affect.  Laboratory Data: Lab Results  Component Value Date   WBC 16.8 (H) 03/04/2022   HGB 16.7 03/04/2022   HCT 48.7 03/04/2022   MCV 88.7 03/04/2022   PLT 203 03/04/2022    Lab Results  Component Value Date   CREATININE 0.91 03/04/2022    No results found for: "PSA"  No results found for: "TESTOSTERONE"  No results found for: "HGBA1C"  Urinalysis    Component Value Date/Time   COLORURINE YELLOW 10/05/2014 1607   APPEARANCEUR Cloudy (A) 02/18/2022 1444   LABSPEC 1.021 10/05/2014 1607   PHURINE 5.0 10/05/2014 1607   GLUCOSEU 1+ (A) 02/18/2022 1444   HGBUR NEGATIVE 10/05/2014 1607   BILIRUBINUR Negative 02/18/2022 1444   KETONESUR trace (5) (A) 07/04/2021 1336   KETONESUR NEGATIVE 10/05/2014 1607   PROTEINUR 1+ (A) 02/18/2022 1444   PROTEINUR NEGATIVE 10/05/2014 1607   UROBILINOGEN 1.0 07/04/2021 1336   UROBILINOGEN 0.2 10/05/2014 1607   NITRITE Negative 02/18/2022 1444   NITRITE NEGATIVE 10/05/2014 1607   LEUKOCYTESUR 1+ (A) 02/18/2022 1444    Lab Results  Component Value Date   LABMICR  See below: 02/18/2022   WBCUA 0-5 02/18/2022   LABEPIT 0-10 02/18/2022   MUCUS Present 11/29/2021   BACTERIA Few 02/18/2022    Pertinent Imaging:  No results found for this or any previous visit.  No results found for this or any previous visit.  No results found for this or any previous visit.  No results found for this or any previous visit.  Results for orders placed during the hospital encounter of 07/22/17  US RENAL  Narrative CLINICAL DATA:  Chronic kidney disease stage 3, obesity  EXAM: RENAL / URINARY TRACT ULTRASOUND COMPLETE  COMPARISON:  CT abdomen pelvis of 10/05/2014  FINDINGS: Right Kidney:  Length: 13.3 cm. No hydronephrosis is seen. The renal parenchymal echogenicity is within normal limits. There is and echogenic focus in the mid upper right kidney of 8 mm in diameter consistent with nonobstructing calculus.  Left Kidney:  Length: 12.6 cm. No hydronephrosis is seen. The parenchyma is normal in echogenicity. No renal calculi are noted.  Bladder:  The urinary bladder is not distended.  Incidentally the echogenicity of the liver appears somewhat inhomogeneous and echogenic which may indicate hepatic steatosis.  IMPRESSION: 1. No hydronephrosis. 2. 8 mm nonobstructing right mid upper renal calculus. 3. Incidental inhomogeneous echogenic liver parenchyma suggesting hepatic steatosis.   Electronically Signed By: Dwyane Dee M.D. On: 07/22/2017 16:11  No valid procedures specified. No results found for this or any previous visit.  Results for orders placed in visit on 11/16/21  CT RENAL STONE STUDY  Narrative CLINICAL DATA:  Nephrolithiasis.  EXAM: CT ABDOMEN AND PELVIS WITHOUT CONTRAST  TECHNIQUE: Multidetector CT imaging of the abdomen and pelvis was performed following the standard protocol without IV contrast.  RADIATION DOSE REDUCTION: This exam was performed according to the departmental dose-optimization program which  includes automated exposure control, adjustment of the mA and/or kV according to patient size and/or use of iterative reconstruction technique.  COMPARISON:  10/05/2014  FINDINGS: Lower chest: No acute findings.  Hepatobiliary: Moderate diffuse hepatic steatosis is again demonstrated. No mass visualized on this unenhanced exam. Gallbladder is unremarkable. No evidence of biliary ductal dilatation.  Pancreas: No mass or inflammatory process visualized on this unenhanced exam.  Spleen:  Within normal limits in size.  Adrenals/Urinary tract: No evidence of urolithiasis or hydronephrosis. Mild diffuse bladder wall thickening, which may be seen with cystitis or chronic bladder outlet obstruction.  Stomach/Bowel: No evidence of obstruction, inflammatory process, or abnormal fluid collections. Normal appendix visualized.  Vascular/Lymphatic: No pathologically enlarged lymph nodes identified. No evidence of abdominal aortic aneurysm. Aortic atherosclerotic calcification incidentally noted.  Reproductive:  No mass or other significant abnormality.  Other:  None.  Musculoskeletal:  No suspicious bone lesions identified.  IMPRESSION: No evidence of urolithiasis or hydronephrosis.  Mild diffuse bladder wall thickening, which may be seen with cystitis or chronic bladder outlet obstruction.  Stable moderate hepatic steatosis.   Electronically Signed By: Danae Orleans M.D. On: 12/10/2021 13:47   Assessment & Plan:    1. Urine retention -flomax 0.4mg  daily - Urinalysis, Routine w reflex microscopic - BLADDER SCAN AMB NON-IMAGING  2. Benign prostatic hyperplasia with urinary obstruction -flomax 0.4mg  daily - Urinalysis, Routine w reflex microscopic - BLADDER SCAN AMB NON-IMAGING  3. Urethral stricture -followup 6 months with flow PVR   No follow-ups on file.  Wilkie Aye, MD  [facility] [place]

## 2022-08-16 NOTE — Progress Notes (Signed)
   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  His wife says he walks with his body leaned forward and his knees ache  He came home last week and had severe pain in both legs although he denies back pain  He has a history of gout he has diabetes hypertension some cholesterol issues and is noted to have a weight of 310 with a BMI of 39.8 he has not had any treatment  Past Medical History:  Diagnosis Date   CKD (chronic kidney disease) 01/08/2017   patient states he does not have.   Diabetes mellitus without complication (HCC)    Essential hypertension, benign 01/08/2017   Gout    High cholesterol 01/08/2017   Hypertension    Obesity     BP 131/71   Pulse 84   Ht 6\' 2"  (1.88 m)   Wt (!) 310 lb (140.6 kg)   BMI 39.80 kg/m    General appearance: Well-developed well-nourished no gross deformities  Cardiovascular normal pulse and perfusion normal color without edema  Neurologically no sensation loss or deficits or pathologic reflexes  Psychological: Awake alert and oriented x3 mood and affect normal  Skin both legs have redness on the right side he has an area where his boots have rubbed and then he has a rash just below his knee on the left  Musculoskeletal: Right and left knee He has mild tenderness on the medial side of his knee no effusion full range of motion in terms of his flexion he may have a slight flexion contracture Muscle tone and strength are normal  Imaging   Lumbar spine multilevel degenerative disc disease slight coronal plane malalignment probably contributes to his symptoms of spinal stenosis  Both knees grade 4 disease with varus alignment  A/P  Encounter Diagnoses  Name Primary?   Bilateral chronic knee pain Yes   Leg weakness, bilateral    Spinal  stenosis of lumbar region with neurogenic claudication    Primary osteoarthritis of right knee    Primary osteoarthritis of left knee     He should be treated for the spinal stenosis with physical therapy and then his arthritis which is not really symptomatic right now can be treated with anti-inflammatories  Follow-up 3 months  Meds ordered this encounter  Medications   meloxicam (MOBIC) 7.5 MG tablet    Sig: Take 1 tablet (7.5 mg total) by mouth daily.    Dispense:  30 tablet    Refill:  5    Patient planning to retire January

## 2022-08-16 NOTE — Patient Instructions (Addendum)
Spinal Stenosis  Spinal stenosis is a condition that happens when the spinal canal narrows. The spinal canal is the space between the bones of your spine (vertebrae). This narrowing puts pressure on the spinal cord and nerves that exit the spine and run down the arms or legs. When nerves exiting the spine are pinched, it can cause pain, numbness, or weakness in the arms or legs. Spinal stenosis can affect the vertebrae in the neck, upper back, and lower back. Spinal stenosis can range from mild to severe. What are the causes? This condition is caused by areas of bone pushing into the spinal canal. This condition may be present at birth (congenital), or it may be caused by: Slow breakdown of your vertebrae (spinal degeneration). This usually starts between 64 and 29 years of age. Injury (trauma) to your spine. Previous spinal surgery. Tumors in your spine. Calcium deposits in your spine. What increases the risk? The following factors may make you more likely to develop this condition: Being older than age 67. Being born with an abnormally shaped spine (congenitalspinal deformity), such as scoliosis. Having arthritis. What are the signs or symptoms? Symptoms of this condition include: Pain in the neck or back that is generally worse with activities, particularly when standing or walking. Numbness, tingling, hot or cold sensations, weakness, or tiredness (fatigue) in your arms or legs. This can happen in one arm or leg, or both. Pain going from the buttock, down the thigh, and to the calf (sciatica). This can happen in one or both legs. Falling frequently. Foot drop. This is when you have trouble lifting the front part of your foot and it drags on the ground when you walk. This can lead to muscle weakness. In more severe cases, you may develop: Problems having a bowel movement or urinating. Difficulty having sex. Loss of feeling in your legs and inability to walk. Symptoms may come on slowly  and get worse over time. In some cases, there are no symptoms. How is this diagnosed? This condition is diagnosed based on your medical history and a physical exam. You may also have tests, such as an X-ray, CT scan, or MRI. How is this treated? Treatment for this condition often focuses on managing your pain and any other symptoms. Treatment may include: Practicing good posture to lessen pressure on your nerves. Exercises to strengthen muscles, build endurance, improve balance, and maintain range of motion. This may include physical therapy to restore movement and strength to your back. Losing weight, if needed. Medicines to reduce inflammation or pain. This may include a medicine that is injected into your spine (steroidinjection). Assistive devices, such as a corset or brace. In some cases, surgery may be needed. The most common procedure is decompression laminectomy. This removes excess bone that puts pressure on your nerve roots. Follow these instructions at home: Managing pain, stiffness, and swelling  Practice good posture. If you were given a brace or a corset, wear it as told by your health care provider. Maintain a healthy weight. Talk with your health care provider if you need help losing weight. If directed, apply heat to the affected area as often as told by your health care provider. Use the heat source that your health care provider recommends, such as a moist heat pack or a heating pad. Place a towel between your skin and the heat source. Leave the heat on for 20-30 minutes. If your skin turns bright red, remove the heat right away to prevent burns. The risk  of burns is higher if you cannot feel pain, heat, or cold. Activity Do all exercises and stretches as told by your health care provider. Do not do any activities that cause pain. You may have to avoid lifting. Ask your health care provider how much you can safely lift. Return to your normal activities as told by your  health care provider. Ask your health care provider what activities are safe for you. General instructions Take over-the-counter and prescription medicines only as told by your health care provider. Do not use any products that contain nicotine or tobacco. These products include cigarettes, chewing tobacco, and vaping devices, such as e-cigarettes. If you need help quitting, ask your health care provider. Eat a healthy diet. This includes plenty of fruits and vegetables, whole grains, and low-fat (lean) protein. Where to find more information [employer] of Arthritis and Musculoskeletal and Skin Diseases: www.niams.http://www.myers.net/ Contact a health care provider if: Your symptoms do not get better or they get worse. You have a fever. Get help right away if: You have new pain or symptoms of severe pain, such as: New or worsening pain in your neck or upper back. Severe pain that cannot be controlled with medicines. A severe headache that gets worse when you stand. You are dizzy. You have vision problems, such as blurred vision or double vision. You have nausea or vomiting. You develop new or worsening numbness or tingling in your back or legs. You lose control of your bowels or bladder. You have pain, redness, swelling, or warmth in your arm or leg. These symptoms may be an emergency. Get help right away. Call 911. Do not wait to see if the symptoms will go away. Do not drive yourself to the hospital. Summary Spinal stenosis is a condition that happens when the spinal canal narrows, putting pressure on the spinal cord or nerves that exit the vertebrae. This condition is caused by areas of bone pushing into the spinal canal. Spinal stenosis can cause numbness, weakness, or pain in the buttocks, neck, back, arms, and legs. This condition is usually diagnosed with your medical history, a physical exam, and tests, such as an X-ray, CT scan, or MRI. This information is not intended to replace  advice given to you by your health care provider. Make sure you discuss any questions you have with your health care provider. Document Revised: 06/05/2021 Document Reviewed: 06/05/2021 Elsevier Patient Education  2024 [employer] Physical therapy has been ordered for you at [facility]. They should call you to schedule, [phone] is the phone number to call, if you want to call to schedule.

## 2022-08-16 NOTE — Patient Instructions (Signed)

## 2022-08-16 NOTE — Addendum Note (Signed)
Addended byCaffie Damme on: 08/16/2022 12:24 PM   Modules accepted: Orders

## 2022-10-01 ENCOUNTER — Other Ambulatory Visit: Payer: Self-pay

## 2022-10-01 ENCOUNTER — Ambulatory Visit (HOSPITAL_COMMUNITY): Payer: 59 | Attending: Orthopedic Surgery | Admitting: Physical Therapy

## 2022-10-01 DIAGNOSIS — R262 Difficulty in walking, not elsewhere classified: Secondary | ICD-10-CM | POA: Diagnosis present

## 2022-10-01 DIAGNOSIS — M25561 Pain in right knee: Secondary | ICD-10-CM | POA: Insufficient documentation

## 2022-10-01 DIAGNOSIS — M1712 Unilateral primary osteoarthritis, left knee: Secondary | ICD-10-CM | POA: Diagnosis not present

## 2022-10-01 DIAGNOSIS — M47816 Spondylosis without myelopathy or radiculopathy, lumbar region: Secondary | ICD-10-CM | POA: Diagnosis present

## 2022-10-01 DIAGNOSIS — R29898 Other symptoms and signs involving the musculoskeletal system: Secondary | ICD-10-CM | POA: Diagnosis not present

## 2022-10-01 DIAGNOSIS — M25562 Pain in left knee: Secondary | ICD-10-CM | POA: Diagnosis present

## 2022-10-01 DIAGNOSIS — M48062 Spinal stenosis, lumbar region with neurogenic claudication: Secondary | ICD-10-CM | POA: Diagnosis not present

## 2022-10-01 DIAGNOSIS — M5416 Radiculopathy, lumbar region: Secondary | ICD-10-CM | POA: Insufficient documentation

## 2022-10-01 DIAGNOSIS — M1711 Unilateral primary osteoarthritis, right knee: Secondary | ICD-10-CM | POA: Insufficient documentation

## 2022-10-01 DIAGNOSIS — G8929 Other chronic pain: Secondary | ICD-10-CM | POA: Insufficient documentation

## 2022-10-01 NOTE — Therapy (Signed)
OUTPATIENT PHYSICAL THERAPY THORACOLUMBAR EVALUATION   Patient Name: Manuel [name] MRN: [id number] DOB:16-Sep-1955, 67 y.o., male Today's Date: 10/01/2022  END OF SESSION:  PT End of Session - 10/01/22 1150    Visit Number 1    Number of Visits 12    Date for PT Re-Evaluation 11/12/22    Authorization Type [employer], [employer]    Progress Note Due on Visit 10    PT Start Time 1110    PT Stop Time 1150    PT Time Calculation (min) 40 min    Activity Tolerance Patient tolerated treatment well    Behavior During Therapy WFL for tasks assessed/performed             Past Medical History:  Diagnosis Date   CKD (chronic kidney disease) 01/08/2017   patient states he does not have.   Diabetes mellitus without complication (HCC)    Essential hypertension, benign 01/08/2017   Gout    High cholesterol 01/08/2017   Hypertension    Obesity    Past Surgical History:  Procedure Laterality Date   CIRCUMCISION     COLONOSCOPY N/A 01/14/2017   Procedure: COLONOSCOPY;  Surgeon: Malissa Hippo, MD;  Location: [facility];  Service: Endoscopy;  Laterality: N/A;   CYSTOSCOPY WITH URETHRAL DILATATION N/A 03/28/2022   Procedure: CYSTOSCOPY WITH URETHRAL DILATATION;  Surgeon: Malen Gauze, MD;  Location: [facility];  Service: Urology;  Laterality: N/A;   DENTAL SURGERY     teeth removed   Patient Active Problem List   Diagnosis Date Noted   Post-traumatic bulbous urethral stricture 03/28/2022   Phimosis 10/16/2019   Acute renal failure syndrome (HCC) 06/23/2017   Anemia of chronic renal failure 06/23/2017   Guaiac positive stools 01/09/2017   Family hx colonic polyps 01/09/2017   Essential hypertension, benign 01/08/2017   High cholesterol 01/08/2017   CKD (chronic kidney disease) 01/08/2017    PCP: Kirstie Peri  REFERRING PROVIDER: Vickki Hearing, MD  REFERRING DIAG: M25.561,M25.562,G89.29 (ICD-10-CM) - Bilateral chronic knee pain R29.898 (ICD-10-CM) - Leg  weakness, bilateral M48.062 (ICD-10-CM) - Spinal stenosis of lumbar region with neurogenic claudication M17.11 (ICD-10-CM) - Primary osteoarthritis of right knee M17.12 (ICD-10-CM) - Primary osteoarthritis of left knee  Rationale for Evaluation and Treatment: Rehabilitation  THERAPY DIAG:  Lumbar spondylosis - Plan: PT plan of care cert/re-cert  Radiculopathy, lumbar region - Plan: PT plan of care cert/re-cert  Chronic pain of right knee - Plan: PT plan of care cert/re-cert  Chronic pain of left knee - Plan: PT plan of care cert/re-cert  Difficulty in walking, not elsewhere classified - Plan: PT plan of care cert/re-cert  ONSET DATE: chronic  SUBJECTIVE STATEMENT: [name] states that both of his knees bother him, the pain is mainly at work when he is standing a lot as well as walking.  He has never fallen but his knees will act as if they are going to give away.  He states that he does not have any back pain at this time.   PERTINENT HISTORY:  OA , Renal failure, obesity  PAIN:  Are you having pain? Yes: NPRS scale: 4/10 B knees, worst pain 7/10, best 0/10 Pain location: B knee , Rt knee worst than Lt  Pain description: tingling  Aggravating factors: WB Relieving factors: rest   PRECAUTIONS: Fall  WEIGHT BEARING RESTRICTIONS: No  FALLS:  Has patient fallen in last 6 months? 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, pulling, pushing, lifting   PLOF: Independent  PATIENT GOALS: less pain   NEXT MD VISIT: not scheduled   OBJECTIVE:   DIAGNOSTIC FINDINGS:  Impression osteoarthritis grade 4 with mild to moderate  varus   Impression extensive arthritis grade 4 left knee  Extensive spondylosis may contribute to findings and symptoms of spinal stenosis      COGNITION: Overall cognitive status: Within functional limits for tasks assessed     SENSATION: WFL  MUSCLE LENGTH: Hamstrings: Right 160 deg; Left 155 deg POSTURE: rounded shoulders and forward head   LUMBAR ROM:   AROM eval  Flexion Fingers 4 " from floor reps increase pain   Extension 10 reps no change   Right lateral flexion   Left lateral flexion   Right rotation   Left rotation    (Blank rows = not tested)  LOWER EXTREMITY ROM:     Active  Right eval Left eval  Hip flexion    Hip extension    Hip abduction    Hip adduction    Hip internal rotation    Hip external rotation    Knee flexion    Knee extension    Ankle dorsiflexion    Ankle plantarflexion    Ankle inversion    Ankle eversion     (Blank rows = not tested)  LOWER EXTREMITY MMT:    MMT Right eval Left eval  Hip flexion 4 5  Hip extension 3- 3-  Hip abduction 4+ 4  Hip adduction    Hip internal rotation    Hip external rotation    Knee flexion 3+ 3+  Knee extension 5 5  Ankle dorsiflexion 5 5  Ankle plantarflexion    Ankle inversion    Ankle eversion     (Blank rows = not tested)   FUNCTIONAL TESTS:  30 seconds chair stand test:  12 ; 14 is average for pt age and sex  2 minute walk test: 390 FT  Rt single leg stance: 4", Lt 2"    TODAY'S TREATMENT:  DATE: 10/01/22:  Evaluation Bridge x 10 Prone green theraband resisted hamstring curl B x 5      PATIENT EDUCATION:  Education details: HEP Person educated: Patient Education method: Explanation Education comprehension: verbalized understanding and returned demonstration  HOME EXERCISE PROGRAM: Access Code: [email or web address] URL:  [email or web address] Date: 10/01/2022 Prepared by: Virgina Organ  Exercises - Supine Bridge  - 2 x daily - 7 x weekly - 1 sets - 10 reps - 5" hold - Prone Hamstring Curl with Anchored Resistance  - 2 x daily - 7 x weekly - 1 sets - 10 reps - 5" hold  ASSESSMENT:  CLINICAL IMPRESSION: Patient is a 67 y.o. male who was seen today for physical therapy evaluation and treatment for evaluation and treatment of his knees and low back.  Evaluation demonstrates decreased ROM, decreased strength, decreased activity tolerance, decreased balance and increased pain.  [name] will benefit from skilled PT to address these issues and maximize his functional ability.    OBJECTIVE IMPAIRMENTS: Abnormal gait, decreased activity tolerance, decreased balance, decreased strength, obesity, and pain.   ACTIVITY LIMITATIONS: bending, standing, squatting, stairs, and locomotion level  PARTICIPATION LIMITATIONS: shopping, community activity, and occupation  PERSONAL FACTORS: 1-2 comorbidities: OA, obesity   are also affecting patient's functional outcome.   REHAB POTENTIAL: Good  CLINICAL DECISION MAKING: Stable/uncomplicated  EVALUATION COMPLEXITY: Moderate   GOALS: Goals reviewed with patient? No  SHORT TERM GOALS: Target date: 10/22/22  PT to be I  in HEP in order to decrease his knee pain to no greater than a 5/10 Baseline: Goal status: INITIAL  2.  PT hip and knee strength to increase to allow pt to come sit to stand from a chair 14 x in 30" for average for age.  Baseline:  Goal status: INITIAL   LONG TERM GOALS: Target date: 11/12/22  PT to be I  in an advanced HEP in order to decrease his knee pain to no greater than a 3/10 Baseline:  Goal status: INITIAL  2.   PT hip and knee strength to increase to allow pt to come sit to stand from a couch without difficulty. Baseline:  Goal status: INITIAL  3.  PT to state that he has had no instances of his knees feeling like  they are going to give way in the past two weeks.  Baseline:  Goal status: INITIAL    PLAN:  PT FREQUENCY: 2x/week  PT DURATION: 6 weeks  PLANNED INTERVENTIONS: Therapeutic exercises, Neuromuscular re-education, Balance training, Gait training, Patient/Family education, Self Care, Joint mobilization, and Manual therapy.  PLAN FOR NEXT SESSION: Begin lumbar extension, ab set, knee to chest, sit to stand, heel raises mini squats and knee to chest. Update HEP and please check B knee ROM    Virgina Organ, PT CLT [phone]  10/01/2022, 3:22 PM

## 2022-10-04 ENCOUNTER — Ambulatory Visit (HOSPITAL_COMMUNITY): Payer: 59 | Admitting: Physical Therapy

## 2022-10-04 DIAGNOSIS — R262 Difficulty in walking, not elsewhere classified: Secondary | ICD-10-CM

## 2022-10-04 DIAGNOSIS — G8929 Other chronic pain: Secondary | ICD-10-CM

## 2022-10-04 DIAGNOSIS — M5416 Radiculopathy, lumbar region: Secondary | ICD-10-CM

## 2022-10-04 DIAGNOSIS — M47816 Spondylosis without myelopathy or radiculopathy, lumbar region: Secondary | ICD-10-CM | POA: Diagnosis not present

## 2022-10-04 NOTE — Therapy (Signed)
OUTPATIENT PHYSICAL THERAPY THORACOLUMBAR treatment    Patient Name: Manuel [name] MRN: [id number] DOB:December 05, 1955, 67 y.o., male Today's Date: 10/04/2022  END OF SESSION:  PT End of Session - 10/04/22 945    Visit Number 2    Number of Visits 12    Date for PT Re-Evaluation 11/12/22    Authorization Type [employer], [employer]    Progress Note Due on Visit 10    PT Start Time 0903    PT Stop Time 0945    PT Time Calculation (min) 42 min    Activity Tolerance Patient tolerated treatment well    Behavior During Therapy [facility] for tasks assessed/performed               Past Medical History:  Diagnosis Date   CKD (chronic kidney disease) 01/08/2017   patient states he does not have.   Diabetes mellitus without complication (HCC)    Essential hypertension, benign 01/08/2017   Gout    High cholesterol 01/08/2017   Hypertension    Obesity    Past Surgical History:  Procedure Laterality Date   CIRCUMCISION     COLONOSCOPY N/A 01/14/2017   Procedure: COLONOSCOPY;  Surgeon: Malissa Hippo, MD;  Location: [facility];  Service: Endoscopy;  Laterality: N/A;   CYSTOSCOPY WITH URETHRAL DILATATION N/A 03/28/2022   Procedure: CYSTOSCOPY WITH URETHRAL DILATATION;  Surgeon: Malen Gauze, MD;  Location: [facility];  Service: Urology;  Laterality: N/A;   DENTAL SURGERY     teeth removed   Patient Active Problem List   Diagnosis Date Noted   Post-traumatic bulbous urethral stricture 03/28/2022   Phimosis 10/16/2019   Acute renal failure syndrome (HCC) 06/23/2017   Anemia of chronic renal failure 06/23/2017   Guaiac positive stools 01/09/2017   Family hx colonic polyps 01/09/2017   Essential hypertension, benign 01/08/2017   High cholesterol 01/08/2017   CKD (chronic kidney disease) 01/08/2017    PCP: Kirstie Peri  REFERRING PROVIDER: Vickki Hearing, MD  REFERRING DIAG: M25.561,M25.562,G89.29 (ICD-10-CM) - Bilateral chronic knee pain R29.898 (ICD-10-CM) - Leg  weakness, bilateral M48.062 (ICD-10-CM) - Spinal stenosis of lumbar region with neurogenic claudication M17.11 (ICD-10-CM) - Primary osteoarthritis of right knee M17.12 (ICD-10-CM) - Primary osteoarthritis of left knee  Rationale for Evaluation and Treatment: Rehabilitation  THERAPY DIAG:  Difficulty in walking, not elsewhere classified  Chronic pain of left knee  Chronic pain of right knee  Lumbar spondylosis  Radiculopathy, lumbar region  ONSET DATE: chronic  SUBJECTIVE STATEMENT: Pt states that he had no difficulty with the exercises that were given him.  PERTINENT HISTORY:  OA , Renal failure, obesity  PAIN:  Are you having pain? Yes: NPRS scale: 4/10 B knees,  Pain location: B knee , Rt knee worst than Lt  Pain description: tingling  Aggravating factors: WB Relieving factors: rest   PRECAUTIONS: Fall  WEIGHT BEARING RESTRICTIONS: No  FALLS:  Has patient fallen in last 6 months? 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, pulling, pushing, lifting   PLOF: Independent  PATIENT GOALS: less pain   NEXT MD VISIT: not scheduled   OBJECTIVE:   DIAGNOSTIC FINDINGS:  Impression osteoarthritis grade 4 with mild to moderate varus   Impression extensive arthritis grade 4 left knee  Extensive spondylosis may contribute to findings and symptoms of spinal stenosis      COGNITION: Overall cognitive status: Within functional limits for tasks assessed     SENSATION: WFL  MUSCLE LENGTH: Hamstrings: Right 160 deg; Left 155 deg POSTURE: rounded shoulders and forward head   LUMBAR ROM:   AROM eval  Flexion Fingers  4 " from floor reps increase pain   Extension 10 reps no change   Right lateral flexion   Left lateral flexion   Right rotation   Left rotation    (Blank rows = not tested)  LOWER EXTREMITY ROM:     Active  Right eval Left eval  Hip flexion    Hip extension    Hip abduction    Hip adduction    Hip internal rotation    Hip external rotation    Knee flexion 120  120  Knee extension Lacking 10 Lacking 3   Ankle dorsiflexion    Ankle plantarflexion    Ankle inversion    Ankle eversion     (Blank rows = not tested)  LOWER EXTREMITY MMT:    MMT Right eval Left eval  Hip flexion 4 5  Hip extension 3- 3-  Hip abduction 4+ 4  Hip adduction    Hip internal rotation    Hip external rotation    Knee flexion 3+ 3+  Knee extension 5 5  Ankle dorsiflexion 5 5  Ankle plantarflexion    Ankle inversion    Ankle eversion     (Blank rows = not tested)   FUNCTIONAL TESTS:  30 seconds chair stand test:  12 ; 14 is average for pt age and sex  2 minute walk test: 390 FT  Rt single leg stance: 4", Lt 2"    TODAY'S TREATMENT:     10/04/22 Standing extension x 10 Sit stand x 10  Ab set x 10 Bridge x 10 Knee to chest x 3  for 30" B  Quad set x 10  B  DATE: 10/01/22:  Evaluation Bridge x 10 Prone green theraband resisted hamstring curl B x 5      PATIENT EDUCATION:  Education details: HEP Person educated: Patient Education method: Explanation Education comprehension: verbalized understanding and returned demonstration  HOME EXERCISE PROGRAM: Access Code: [email or web address] URL: [email or web address] Date: 10/01/2022 Prepared by: Virgina Organ  Exercises - Supine Bridge  - 2 x daily - 7 x weekly - 1 sets - 10 reps - 5" hold - Prone Hamstring Curl with Anchored Resistance  - 2 x daily - 7 x weekly - 1 sets - 10 reps - 5" hold  7/12: - Supine  Transversus Abdominis Bracing - Hands on Stomach  - 2 x daily - 7 x weekly - 1 sets - 10 reps - 3-5" hold - Supine Single Knee to Chest Stretch  - 2 x daily - 7 x weekly - 1 sets - 3 reps - 30 hold - Sit to Stand Without Arm Support  - 2 x daily - 7 x weekly - 1 sets - 10 reps - Heel Raises with Counter Support  - 2 x daily - 7 x weekly - 1 sets - 10 reps - 3-5" hold - Mini Squat with Counter Support  - 2 x daily - 7 x weekly - 1 sets - 10 reps - 3-5" hold - Standing Lumbar Extension  - 3 x daily - 7 x weekly - 1 sets - 10 reps - 3-5" hold ASSESSMENT:  CLINICAL IMPRESSION: Therapist updated pt HEP,  Reviewed evaluation and goals.  Pt  demonstrates decreased ROM, decreased strength, decreased activity tolerance, decreased balance and increased pain and will benefit from skilled PT to address these issues and maximize his functional ability.    OBJECTIVE IMPAIRMENTS: Abnormal gait, decreased activity tolerance, decreased balance, decreased strength, obesity, and pain.   ACTIVITY LIMITATIONS: bending, standing, squatting, stairs, and locomotion level  PARTICIPATION LIMITATIONS: shopping, community activity, and occupation  PERSONAL FACTORS: 1-2 comorbidities: OA, obesity   are also affecting patient's functional outcome.   REHAB POTENTIAL: Good  CLINICAL DECISION MAKING: Stable/uncomplicated  EVALUATION COMPLEXITY: Moderate   GOALS: Goals reviewed with patient? No  SHORT TERM GOALS: Target date: 10/22/22  PT to be I  in HEP in order to decrease his knee pain to no greater than a 5/10 Baseline: Goal status: on-going   2.  PT hip and knee strength to increase to allow pt to come sit to stand from a chair 14 x in 30" for average for age.  Baseline:  Goal status:on-going   LONG TERM GOALS: Target date: 11/12/22  PT to be I  in an advanced HEP in order to decrease his knee pain to no greater than a 3/10 Baseline:  Goal status: on-going  2.   PT hip and knee strength to increase to  allow pt to come sit to stand from a couch without difficulty. Baseline:  Goal status: on-going  3.  PT to state that he has had no instances of his knees feeling like they are going to give way in the past two weeks.  Baseline:  Goal status: on-going    PLAN:  PT FREQUENCY: 2x/week  PT DURATION: 6 weeks  PLANNED INTERVENTIONS: Therapeutic exercises, Neuromuscular re-education, Balance training, Gait training, Patient/Family education, Self Care, Joint mobilization, and Manual therapy.  PLAN FOR NEXT SESSION: continue with knee and back rehab, give SAQ and QSet as HEP    Virgina Organ, PT CLT [phone]  10/04/2022, 9:51 AM

## 2022-10-09 ENCOUNTER — Ambulatory Visit (INDEPENDENT_AMBULATORY_CARE_PROVIDER_SITE_OTHER): Payer: 59 | Admitting: Orthopedic Surgery

## 2022-10-09 ENCOUNTER — Encounter (HOSPITAL_COMMUNITY): Payer: PRIVATE HEALTH INSURANCE

## 2022-10-09 ENCOUNTER — Encounter: Payer: Self-pay | Admitting: Orthopedic Surgery

## 2022-10-09 ENCOUNTER — Telehealth (HOSPITAL_COMMUNITY): Payer: Self-pay

## 2022-10-09 VITALS — BP 104/60 | HR 93 | Ht 74.0 in | Wt 315.0 lb

## 2022-10-09 DIAGNOSIS — M542 Cervicalgia: Secondary | ICD-10-CM

## 2022-10-09 DIAGNOSIS — M48062 Spinal stenosis, lumbar region with neurogenic claudication: Secondary | ICD-10-CM

## 2022-10-09 DIAGNOSIS — M170 Bilateral primary osteoarthritis of knee: Secondary | ICD-10-CM | POA: Diagnosis not present

## 2022-10-09 DIAGNOSIS — M1712 Unilateral primary osteoarthritis, left knee: Secondary | ICD-10-CM

## 2022-10-09 DIAGNOSIS — M1711 Unilateral primary osteoarthritis, right knee: Secondary | ICD-10-CM

## 2022-10-09 MED ORDER — METHYLPREDNISOLONE ACETATE 40 MG/ML IJ SUSP
40.0000 mg | Freq: Once | INTRAMUSCULAR | Status: AC
  Administered 2022-10-09: 40 mg via INTRAMUSCULAR

## 2022-10-09 NOTE — Telephone Encounter (Signed)
No show, called and talked to wife concerning missed apt today. She stated he is still in bed though reports another spell occurs yesterday with increased numbness causing near fall. Reports he has apt scheduled with MD Romeo Apple. Reminded next apt date and time and requested pt to call and cancel/reschedule apts in the future.   Becky Sax, LPTA/CLT; [place] [phone]

## 2022-10-09 NOTE — Progress Notes (Signed)
Chief Complaint  Patient presents with   Back Pain    Has had pain for long duration worse since Monday     67 year old male with diabetes and BMI of 40 has severe arthritis in both knees and lower back pain.  Patient is in therapy for his lower back pain was taken meloxicam for his knee pain.  Patient developed severe pain in his knees back and neck Monday and had to go home from work try to go back to work Tuesday still cannot go back to work and stay at work  No injuries  BP 104/60   Pulse 93   Ht 6\' 2"  (1.88 m)   Wt (!) 315 lb (142.9 kg)   BMI 40.44 kg/m   Exam findings  Tenderness at the base of the cervical spine but normal range of motion no radicular symptoms negative Spurling's   Flexion of the extension of the knees have not changed he has limited range of motion secondary to his arthritis he has tenderness along the medial joint lines and varus alignment  Lumbar spine is mildly tender  Straight leg raises are negative  Assessment and plan  67 year old male with end-stage arthritis both knees, back pain from degenerative disc disease and spinal stenosis cute onset of neck pain and worsening pain in his knees and back  I do not see an injury here and no root signs of cauda equina  Recommend rest  IM Depo-Medrol 40  Out of work for several days return to work on Monday  Continue attempts at 20 pound weight loss so that the patient can have his knee replacements done  Procedures  Yes  40 mg Depo-Medrol injected per nurse intramuscular  Patient did give consent site was confirmed medication given without event  Encounter Diagnoses  Name Primary?   Primary osteoarthritis of left knee Yes   Spinal stenosis of lumbar region with neurogenic claudication    Primary osteoarthritis of right knee    Acute neck pain

## 2022-10-13 ENCOUNTER — Emergency Department (HOSPITAL_COMMUNITY): Payer: 59

## 2022-10-13 ENCOUNTER — Encounter (HOSPITAL_COMMUNITY): Payer: Self-pay

## 2022-10-13 ENCOUNTER — Other Ambulatory Visit: Payer: Self-pay

## 2022-10-13 ENCOUNTER — Emergency Department (HOSPITAL_COMMUNITY)
Admission: EM | Admit: 2022-10-13 | Discharge: 2022-10-13 | Disposition: A | Discharge status: Home / Self Care | Payer: 59 | Attending: Emergency Medicine | Admitting: Emergency Medicine

## 2022-10-13 DIAGNOSIS — N189 Chronic kidney disease, unspecified: Secondary | ICD-10-CM | POA: Insufficient documentation

## 2022-10-13 DIAGNOSIS — W010XXA Fall on same level from slipping, tripping and stumbling without subsequent striking against object, initial encounter: Secondary | ICD-10-CM | POA: Insufficient documentation

## 2022-10-13 DIAGNOSIS — W19XXXA Unspecified fall, initial encounter: Secondary | ICD-10-CM

## 2022-10-13 DIAGNOSIS — Z7984 Long term (current) use of oral hypoglycemic drugs: Secondary | ICD-10-CM | POA: Insufficient documentation

## 2022-10-13 DIAGNOSIS — E1122 Type 2 diabetes mellitus with diabetic chronic kidney disease: Secondary | ICD-10-CM | POA: Diagnosis not present

## 2022-10-13 DIAGNOSIS — S3991XA Unspecified injury of abdomen, initial encounter: Secondary | ICD-10-CM | POA: Diagnosis not present

## 2022-10-13 DIAGNOSIS — Z7982 Long term (current) use of aspirin: Secondary | ICD-10-CM | POA: Diagnosis not present

## 2022-10-13 DIAGNOSIS — Z79899 Other long term (current) drug therapy: Secondary | ICD-10-CM | POA: Insufficient documentation

## 2022-10-13 DIAGNOSIS — S299XXA Unspecified injury of thorax, initial encounter: Secondary | ICD-10-CM | POA: Insufficient documentation

## 2022-10-13 DIAGNOSIS — I129 Hypertensive chronic kidney disease with stage 1 through stage 4 chronic kidney disease, or unspecified chronic kidney disease: Secondary | ICD-10-CM | POA: Diagnosis not present

## 2022-10-13 DIAGNOSIS — R0789 Other chest pain: Secondary | ICD-10-CM | POA: Diagnosis present

## 2022-10-13 DIAGNOSIS — S298XXA Other specified injuries of thorax, initial encounter: Secondary | ICD-10-CM

## 2022-10-13 LAB — COMPREHENSIVE METABOLIC PANEL
ALT: 32 U/L (ref 0–44)
AST: 35 U/L (ref 15–41)
Albumin: 3.7 g/dL (ref 3.5–5.0)
Alkaline Phosphatase: 54 U/L (ref 38–126)
Anion gap: 10 (ref 5–15)
BUN: 13 mg/dL (ref 8–23)
CO2: 21 mmol/L — ABNORMAL LOW (ref 22–32)
Calcium: 8.5 mg/dL — ABNORMAL LOW (ref 8.9–10.3)
Chloride: 101 mmol/L (ref 98–111)
Creatinine, Ser: 0.95 mg/dL (ref 0.61–1.24)
GFR, Estimated: >60 mL/min (ref >60)
Glucose, Bld: 128 mg/dL — ABNORMAL HIGH (ref 70–99)
Potassium: 3.9 mmol/L (ref 3.5–5.1)
Sodium: 132 mmol/L — ABNORMAL LOW (ref 135–145)
Total Bilirubin: 1.3 mg/dL — ABNORMAL HIGH (ref 0.3–1.2)
Total Protein: 6.9 g/dL (ref 6.5–8.1)

## 2022-10-13 LAB — CBC
HCT: 39.6 % (ref 39.0–52.0)
Hemoglobin: 13.3 g/dL (ref 13.0–17.0)
MCH: 30.7 pg (ref 26.0–34.0)
MCHC: 33.6 g/dL (ref 30.0–36.0)
MCV: 91.5 fL (ref 80.0–100.0)
Platelets: 156 10*3/uL (ref 150–400)
RBC: 4.33 MIL/uL (ref 4.22–5.81)
RDW: 13.9 % (ref 11.5–15.5)
WBC: 10.1 10*3/uL (ref 4.0–10.5)
nRBC: 0 % (ref 0.0–0.2)

## 2022-10-13 MED ORDER — IOHEXOL 300 MG/ML  SOLN
100.0000 mL | Freq: Once | INTRAMUSCULAR | Status: AC | PRN
  Administered 2022-10-13: 100 mL via INTRAVENOUS

## 2022-10-13 MED ORDER — METHOCARBAMOL 500 MG PO TABS: 500.0000 mg | ORAL_TABLET | Freq: Three times a day (TID) | ORAL | 0 refills | Status: DC | PRN

## 2022-10-13 NOTE — ED Provider Notes (Signed)
[place] EMERGENCY DEPARTMENT AT [facility] Provider Note   CSN: [id number] Arrival date & time: 10/13/22  2053     History  Chief Complaint  Patient presents with   Manuel [name] is a 67 y.o. male.   Fall  Patient with fall.  Tripped on a hose while we can today.  [name] forward onto his chest and abdomen.  Pain in his chest and upper abdomen.  Hurts to breathe.  Not on blood thinners.  Denies hitting head.    Past Medical History:  Diagnosis Date   CKD (chronic kidney disease) 01/08/2017   patient states he does not have.   Diabetes mellitus without complication (HCC)    Essential hypertension, benign 01/08/2017   Gout    High cholesterol 01/08/2017   Hypertension    Obesity     Home Medications Prior to Admission medications   Medication Sig Start Date End Date Taking? Authorizing Provider  methocarbamol (ROBAXIN) 500 MG tablet Take 1 tablet (500 mg total) by mouth every 8 (eight) hours as needed for muscle spasms. 10/13/22  Yes Benjiman Core, MD  allopurinol (ZYLOPRIM) 300 MG tablet Take 300 mg by mouth daily.    [provider]  amLODipine (NORVASC) 5 MG tablet Take 5 mg by mouth daily. 01/22/22   [provider]  aspirin (ASPIRIN EC) 81 MG EC tablet Take 81 mg by mouth daily. Swallow whole. 01/15/17   Malissa Hippo, MD  atenolol (TENORMIN) 25 MG tablet Take 25 mg by mouth daily.    [provider]  DULoxetine (CYMBALTA) 30 MG capsule Take 30 mg by mouth daily. 03/19/22   [provider]  losartan (COZAAR) 100 MG tablet Take 100 mg by mouth daily.    [provider]  meloxicam (MOBIC) 7.5 MG tablet Take 1 tablet (7.5 mg total) by mouth daily. 08/16/22   Vickki Hearing, MD  metFORMIN (GLUCOPHAGE) 500 MG tablet Take 1 tablet (500 mg total) by mouth 2 (two) times daily with a meal. 07/04/21   Particia Nearing, PA-C  pregabalin (LYRICA) 300 MG capsule Take 300 mg by mouth 2 (two) times  daily. 03/23/22   [provider]  rosuvastatin (CRESTOR) 5 MG tablet Take 5 mg by mouth daily. 03/07/22   [provider]  RYBELSUS 7 MG TABS Take 1 tablet by mouth daily. 01/14/22   [provider]  tamsulosin (FLOMAX) 0.4 MG CAPS capsule Take 1 capsule (0.4 mg total) by mouth daily after supper. 08/16/22   [name], [name], MD      Allergies    Patient has no known allergies.    Review of Systems   Review of Systems  Physical Exam Updated Vital Signs BP 137/68 (BP Location: Right Arm)   Pulse 67   Temp 97.8 F (36.6 C) (Oral)   Resp 20   Ht 6\' 2"  (1.88 m)   Wt (!) 142.9 kg   SpO2 94%   BMI 40.44 kg/m  Physical Exam Vitals and nursing note reviewed.  Cardiovascular:     Rate and Rhythm: Regular rhythm.  Pulmonary:     Comments: Tenderness on anterior chest without crepitus or deformity. Chest:     Chest wall: Tenderness present.  Abdominal:     Tenderness: There is abdominal tenderness.     Comments: Left lower quadrant tenderness rebound or guarding.  No hernia palpated.  Musculoskeletal:     Cervical back: Neck supple. No tenderness.  Comments: No extremity tenderness.  Skin:    Capillary Refill: Capillary refill takes less than 2 seconds.  Neurological:     Mental Status: He is alert and oriented to person, place, and time.     ED Results / Procedures / Treatments   Labs (all labs ordered are listed, but only abnormal results are displayed) Labs Reviewed  COMPREHENSIVE METABOLIC PANEL - Abnormal; Notable for the following components:      Result Value   Sodium 132 (*)    CO2 21 (*)    Glucose, Bld 128 (*)    Calcium 8.5 (*)    Total Bilirubin 1.3 (*)    All other components within normal limits  CBC    EKG EKG Interpretation Date/Time:  Sunday October 13 2022 21:02:11 EDT Ventricular Rate:  75 PR Interval:  202 QRS Duration:  98 QT Interval:  390 QTC Calculation: 435 R Axis:   15  Text Interpretation: Normal sinus  rhythm with sinus arrhythmia Possible Anterior infarct , age undetermined Abnormal ECG When compared with ECG of 04-Mar-2022 00:28, No significant change was found Confirmed by Dione Booze ([id number]) on 10/13/2022 11:24:39 PM  Radiology CT CHEST ABDOMEN PELVIS W CONTRAST  Result Date: 10/13/2022 CLINICAL DATA:  Recent trip and fall, initial encounter EXAM: CT CHEST, ABDOMEN, AND PELVIS WITH CONTRAST TECHNIQUE: Multidetector CT imaging of the chest, abdomen and pelvis was performed following the standard protocol during bolus administration of intravenous contrast. RADIATION DOSE REDUCTION: This exam was performed according to the departmental dose-optimization program which includes automated exposure control, adjustment of the mA and/or kV according to patient size and/or use of iterative reconstruction technique. CONTRAST:  OMNIPAQUE IOHEXOL 300 MG/ML  SOLN COMPARISON:  None Available. FINDINGS: CT CHEST FINDINGS Cardiovascular: Thoracic aorta demonstrates atherosclerotic calcifications without aneurysmal dilatation or dissection. No cardiac enlargement is seen. Pulmonary artery as visualized is within normal limits. Mild coronary calcifications are noted. Mediastinum/Nodes: Thoracic inlet is within normal limits. Scattered small mediastinal and hilar lymph nodes are noted. These have increased slightly in the interval from prior exam from 2023. These are likely reactive in nature. The esophagus is within normal limits. Lungs/Pleura: Lungs are well aerated bilaterally. No focal infiltrate or sizable effusion is seen. No parenchymal nodules are seen. Musculoskeletal: No chest wall mass or suspicious bone lesions identified. No rib abnormality is noted. CT ABDOMEN PELVIS FINDINGS Hepatobiliary: Fatty infiltration of the liver is noted. The gallbladder is decompressed. Pancreas: Unremarkable. No pancreatic ductal dilatation or surrounding inflammatory changes. Spleen: Normal in size without focal abnormality.  Adrenals/Urinary Tract: Adrenal glands are within normal limits. Kidneys demonstrate a normal enhancement pattern bilaterally. No renal calculi or obstructive changes are seen. Normal excretion is noted on delayed images. The bladder is well distended. Stomach/Bowel: Stomach is distended with ingested food stuffs. No obstructive or inflammatory changes of the colon are seen. The appendix is within normal limits. No obstructive or inflammatory changes of the small bowel are seen. Vascular/Lymphatic: Aortic atherosclerosis. No enlarged abdominal or pelvic lymph nodes. Reproductive: Prostate is unremarkable. Other: No abdominal wall hernia or abnormality. No abdominopelvic ascites. Musculoskeletal: Degenerative changes of lumbar spine are noted. IMPRESSION: CT of the chest: Scattered small mediastinal and hilar lymph nodes some of which have normal fatty hila. These are slightly more prominent than that seen on the prior exam but likely reactive in nature. No other focal abnormality is noted. CT abdomen and pelvis: Fatty liver. No acute abnormality noted. Electronically Signed   By: Alcide Clever  M.D.   On: 10/13/2022 23:06    Procedures Procedures    Medications Ordered in ED Medications  iohexol (OMNIPAQUE) 300 MG/ML solution 100 mL (100 mLs Intravenous Contrast Given 10/13/22 2247)    ED Course/ Medical Decision Making/ A&P                             Medical Decision Making Amount and/or Complexity of Data Reviewed Labs: ordered. Radiology: ordered.  Risk Prescription drug management.   Patient with fall.  Chest abdominal pain.  Higher risk due to the location to injury such as rib fractures sternal fractures or intra-abdominal pathology such as splenic laceration.  Will get blood work and CT scan to evaluate.  Blood work reassuring.  CT scan chest abdomen pelvis reassuring.  Will discharge home with outpatient follow-up.        Final Clinical Impression(s) / ED Diagnoses Final  diagnoses:  Fall, initial encounter  Blunt chest trauma, initial encounter  Blunt abdominal trauma, initial encounter    Rx / DC Orders ED Discharge Orders          Ordered    methocarbamol (ROBAXIN) 500 MG tablet  Every 8 hours PRN        10/13/22 2322              Benjiman Core, MD 10/13/22 2344

## 2022-10-13 NOTE — ED Notes (Signed)
Patient transported to CT 

## 2022-10-13 NOTE — ED Triage Notes (Signed)
Tripped on hose while weed eating Feel face forward Did not hit head, denies LOC Pt stated he takes blood thinners.   Sternal and rib pain Hurts to breathe.   No brusing or marks noted in triage

## 2022-10-14 ENCOUNTER — Encounter (HOSPITAL_COMMUNITY): Payer: PRIVATE HEALTH INSURANCE | Admitting: Physical Therapy

## 2022-10-16 ENCOUNTER — Telehealth (HOSPITAL_COMMUNITY): Payer: Self-pay | Admitting: Physical Therapy

## 2022-10-16 ENCOUNTER — Encounter (HOSPITAL_COMMUNITY): Payer: PRIVATE HEALTH INSURANCE | Admitting: Physical Therapy

## 2022-10-16 NOTE — Telephone Encounter (Signed)
Second NS  Pt did not show for appt.  Called and spoke to spouse who did not give reason for NS.  Explained NS policy that he would have to schedule one appt at a time and would be discharged after three NS.  Spouse reminded of next appt date/time.   Lurena Nida, PTA/CLT [facility] Ph: [phone]

## 2022-10-21 ENCOUNTER — Ambulatory Visit (HOSPITAL_COMMUNITY): Payer: 59

## 2022-10-21 ENCOUNTER — Telehealth (HOSPITAL_COMMUNITY): Payer: Self-pay

## 2022-10-21 NOTE — Telephone Encounter (Signed)
No show #3, called with no answer and mail box has not been set up so unable to leave message concerning missed apt today. Pt to be discharged due to no show policy.   Becky Sax, LPTA/CLT; [facility] [place] [phone]

## 2022-10-24 ENCOUNTER — Encounter (HOSPITAL_COMMUNITY): Payer: PRIVATE HEALTH INSURANCE | Admitting: Physical Therapy

## 2022-10-29 ENCOUNTER — Encounter (HOSPITAL_COMMUNITY): Payer: PRIVATE HEALTH INSURANCE

## 2022-10-31 ENCOUNTER — Encounter (HOSPITAL_COMMUNITY): Payer: PRIVATE HEALTH INSURANCE

## 2022-11-04 ENCOUNTER — Encounter (HOSPITAL_COMMUNITY): Payer: PRIVATE HEALTH INSURANCE

## 2022-11-06 ENCOUNTER — Encounter (HOSPITAL_COMMUNITY): Payer: PRIVATE HEALTH INSURANCE

## 2022-11-11 ENCOUNTER — Encounter (HOSPITAL_COMMUNITY): Payer: PRIVATE HEALTH INSURANCE | Admitting: Physical Therapy

## 2022-11-12 ENCOUNTER — Encounter (HOSPITAL_COMMUNITY): Payer: Self-pay | Admitting: Physical Therapy

## 2022-11-12 NOTE — Therapy (Signed)
 PHYSICAL THERAPY DISCHARGE SUMMARY  Visits from Start of Care: 2  Current functional level related to goals / functional outcomes: Unknown as pt did not return.     Remaining deficits: Unknown as pt did not return.     Education / Equipment: HEP   Patient agrees to discharge. Patient goals were not met. Patient is being discharged due to not returning since the last visit. Virgina Organ, PT CLT [phone]

## 2022-11-13 ENCOUNTER — Encounter (HOSPITAL_COMMUNITY): Payer: PRIVATE HEALTH INSURANCE | Admitting: Physical Therapy

## 2022-11-18 ENCOUNTER — Encounter (HOSPITAL_COMMUNITY): Payer: PRIVATE HEALTH INSURANCE | Admitting: Physical Therapy

## 2022-11-22 ENCOUNTER — Ambulatory Visit: Payer: PRIVATE HEALTH INSURANCE | Admitting: Orthopedic Surgery

## 2023-01-24 DIAGNOSIS — Z299 Encounter for prophylactic measures, unspecified: Secondary | ICD-10-CM | POA: Diagnosis not present

## 2023-01-24 DIAGNOSIS — I10 Essential (primary) hypertension: Secondary | ICD-10-CM | POA: Diagnosis not present

## 2023-01-24 DIAGNOSIS — N183 Chronic kidney disease, stage 3 unspecified: Secondary | ICD-10-CM | POA: Diagnosis not present

## 2023-01-24 DIAGNOSIS — E1122 Type 2 diabetes mellitus with diabetic chronic kidney disease: Secondary | ICD-10-CM | POA: Diagnosis not present

## 2023-01-24 DIAGNOSIS — Z2821 Immunization not carried out because of patient refusal: Secondary | ICD-10-CM | POA: Diagnosis not present

## 2023-01-24 DIAGNOSIS — I739 Peripheral vascular disease, unspecified: Secondary | ICD-10-CM | POA: Diagnosis not present

## 2023-01-24 DIAGNOSIS — E1169 Type 2 diabetes mellitus with other specified complication: Secondary | ICD-10-CM | POA: Diagnosis not present

## 2023-02-17 ENCOUNTER — Ambulatory Visit: Payer: PRIVATE HEALTH INSURANCE | Admitting: Urology

## 2023-02-18 ENCOUNTER — Other Ambulatory Visit: Payer: Self-pay | Admitting: Orthopedic Surgery

## 2023-02-18 DIAGNOSIS — M1711 Unilateral primary osteoarthritis, right knee: Secondary | ICD-10-CM

## 2023-02-18 DIAGNOSIS — M48062 Spinal stenosis, lumbar region with neurogenic claudication: Secondary | ICD-10-CM

## 2023-02-18 DIAGNOSIS — M1712 Unilateral primary osteoarthritis, left knee: Secondary | ICD-10-CM

## 2023-02-19 DIAGNOSIS — E7800 Pure hypercholesterolemia, unspecified: Secondary | ICD-10-CM | POA: Diagnosis not present

## 2023-02-19 DIAGNOSIS — Z299 Encounter for prophylactic measures, unspecified: Secondary | ICD-10-CM | POA: Diagnosis not present

## 2023-02-19 DIAGNOSIS — Z7189 Other specified counseling: Secondary | ICD-10-CM | POA: Diagnosis not present

## 2023-02-19 DIAGNOSIS — R5383 Other fatigue: Secondary | ICD-10-CM | POA: Diagnosis not present

## 2023-02-19 DIAGNOSIS — Z1339 Encounter for screening examination for other mental health and behavioral disorders: Secondary | ICD-10-CM | POA: Diagnosis not present

## 2023-02-19 DIAGNOSIS — R52 Pain, unspecified: Secondary | ICD-10-CM | POA: Diagnosis not present

## 2023-02-19 DIAGNOSIS — Z1331 Encounter for screening for depression: Secondary | ICD-10-CM | POA: Diagnosis not present

## 2023-02-19 DIAGNOSIS — E119 Type 2 diabetes mellitus without complications: Secondary | ICD-10-CM | POA: Diagnosis not present

## 2023-02-19 DIAGNOSIS — Z0000 Encounter for general adult medical examination without abnormal findings: Secondary | ICD-10-CM | POA: Diagnosis not present

## 2023-02-19 DIAGNOSIS — I10 Essential (primary) hypertension: Secondary | ICD-10-CM | POA: Diagnosis not present

## 2023-03-07 DIAGNOSIS — I10 Essential (primary) hypertension: Secondary | ICD-10-CM | POA: Diagnosis not present

## 2023-03-07 DIAGNOSIS — N183 Chronic kidney disease, stage 3 unspecified: Secondary | ICD-10-CM | POA: Diagnosis not present

## 2023-03-07 DIAGNOSIS — E1169 Type 2 diabetes mellitus with other specified complication: Secondary | ICD-10-CM | POA: Diagnosis not present

## 2023-03-07 DIAGNOSIS — R52 Pain, unspecified: Secondary | ICD-10-CM | POA: Diagnosis not present

## 2023-03-07 DIAGNOSIS — Z299 Encounter for prophylactic measures, unspecified: Secondary | ICD-10-CM | POA: Diagnosis not present

## 2023-03-11 ENCOUNTER — Encounter (HOSPITAL_COMMUNITY): Payer: Self-pay | Admitting: *Deleted

## 2023-03-11 ENCOUNTER — Emergency Department (HOSPITAL_COMMUNITY): Payer: 59

## 2023-03-11 ENCOUNTER — Other Ambulatory Visit: Payer: Self-pay

## 2023-03-11 ENCOUNTER — Inpatient Hospital Stay (HOSPITAL_COMMUNITY)
Admission: EM | Admit: 2023-03-11 | Discharge: 2023-03-14 | DRG: 391 | Disposition: A | Discharge status: Home / Self Care | Payer: Medicare HMO | Attending: Internal Medicine | Admitting: Internal Medicine

## 2023-03-11 DIAGNOSIS — I10 Essential (primary) hypertension: Secondary | ICD-10-CM | POA: Diagnosis not present

## 2023-03-11 DIAGNOSIS — E118 Type 2 diabetes mellitus with unspecified complications: Secondary | ICD-10-CM | POA: Diagnosis not present

## 2023-03-11 DIAGNOSIS — I471 Supraventricular tachycardia, unspecified: Secondary | ICD-10-CM | POA: Diagnosis not present

## 2023-03-11 DIAGNOSIS — E66813 Obesity, class 3: Secondary | ICD-10-CM | POA: Diagnosis present

## 2023-03-11 DIAGNOSIS — Z7984 Long term (current) use of oral hypoglycemic drugs: Secondary | ICD-10-CM

## 2023-03-11 DIAGNOSIS — E7800 Pure hypercholesterolemia, unspecified: Secondary | ICD-10-CM | POA: Diagnosis present

## 2023-03-11 DIAGNOSIS — R0600 Dyspnea, unspecified: Secondary | ICD-10-CM | POA: Diagnosis not present

## 2023-03-11 DIAGNOSIS — E1165 Type 2 diabetes mellitus with hyperglycemia: Secondary | ICD-10-CM | POA: Diagnosis not present

## 2023-03-11 DIAGNOSIS — Z79899 Other long term (current) drug therapy: Secondary | ICD-10-CM

## 2023-03-11 DIAGNOSIS — Z8249 Family history of ischemic heart disease and other diseases of the circulatory system: Secondary | ICD-10-CM

## 2023-03-11 DIAGNOSIS — R011 Cardiac murmur, unspecified: Secondary | ICD-10-CM | POA: Diagnosis not present

## 2023-03-11 DIAGNOSIS — Z6841 Body Mass Index (BMI) 40.0 and over, adult: Secondary | ICD-10-CM | POA: Diagnosis not present

## 2023-03-11 DIAGNOSIS — J40 Bronchitis, not specified as acute or chronic: Secondary | ICD-10-CM

## 2023-03-11 DIAGNOSIS — K760 Fatty (change of) liver, not elsewhere classified: Secondary | ICD-10-CM | POA: Diagnosis present

## 2023-03-11 DIAGNOSIS — N35919 Unspecified urethral stricture, male, unspecified site: Secondary | ICD-10-CM | POA: Diagnosis present

## 2023-03-11 DIAGNOSIS — E871 Hypo-osmolality and hyponatremia: Secondary | ICD-10-CM | POA: Diagnosis not present

## 2023-03-11 DIAGNOSIS — M48062 Spinal stenosis, lumbar region with neurogenic claudication: Secondary | ICD-10-CM

## 2023-03-11 DIAGNOSIS — R079 Chest pain, unspecified: Principal | ICD-10-CM | POA: Diagnosis present

## 2023-03-11 DIAGNOSIS — F1010 Alcohol abuse, uncomplicated: Secondary | ICD-10-CM | POA: Diagnosis not present

## 2023-03-11 DIAGNOSIS — R1013 Epigastric pain: Principal | ICD-10-CM | POA: Diagnosis present

## 2023-03-11 DIAGNOSIS — J9601 Acute respiratory failure with hypoxia: Secondary | ICD-10-CM

## 2023-03-11 DIAGNOSIS — M109 Gout, unspecified: Secondary | ICD-10-CM | POA: Diagnosis present

## 2023-03-11 DIAGNOSIS — R590 Localized enlarged lymph nodes: Secondary | ICD-10-CM | POA: Diagnosis not present

## 2023-03-11 DIAGNOSIS — Z7985 Long-term (current) use of injectable non-insulin antidiabetic drugs: Secondary | ICD-10-CM | POA: Diagnosis not present

## 2023-03-11 DIAGNOSIS — I700 Atherosclerosis of aorta: Secondary | ICD-10-CM | POA: Diagnosis present

## 2023-03-11 DIAGNOSIS — N400 Enlarged prostate without lower urinary tract symptoms: Secondary | ICD-10-CM | POA: Diagnosis present

## 2023-03-11 DIAGNOSIS — K219 Gastro-esophageal reflux disease without esophagitis: Secondary | ICD-10-CM

## 2023-03-11 DIAGNOSIS — F32A Depression, unspecified: Secondary | ICD-10-CM | POA: Diagnosis present

## 2023-03-11 DIAGNOSIS — J90 Pleural effusion, not elsewhere classified: Secondary | ICD-10-CM | POA: Diagnosis not present

## 2023-03-11 DIAGNOSIS — M1711 Unilateral primary osteoarthritis, right knee: Secondary | ICD-10-CM

## 2023-03-11 DIAGNOSIS — I2510 Atherosclerotic heart disease of native coronary artery without angina pectoris: Secondary | ICD-10-CM | POA: Diagnosis not present

## 2023-03-11 DIAGNOSIS — R0789 Other chest pain: Secondary | ICD-10-CM | POA: Diagnosis not present

## 2023-03-11 DIAGNOSIS — Z7982 Long term (current) use of aspirin: Secondary | ICD-10-CM

## 2023-03-11 DIAGNOSIS — M1712 Unilateral primary osteoarthritis, left knee: Secondary | ICD-10-CM

## 2023-03-11 DIAGNOSIS — E8770 Fluid overload, unspecified: Secondary | ICD-10-CM | POA: Diagnosis present

## 2023-03-11 DIAGNOSIS — J9811 Atelectasis: Secondary | ICD-10-CM | POA: Diagnosis not present

## 2023-03-11 DIAGNOSIS — R0602 Shortness of breath: Secondary | ICD-10-CM | POA: Diagnosis not present

## 2023-03-11 LAB — BASIC METABOLIC PANEL
Anion gap: 8 (ref 5–15)
BUN: 11 mg/dL (ref 8–23)
CO2: 24 mmol/L (ref 22–32)
Calcium: 9 mg/dL (ref 8.9–10.3)
Chloride: 96 mmol/L — ABNORMAL LOW (ref 98–111)
Creatinine, Ser: 1.1 mg/dL (ref 0.61–1.24)
GFR, Estimated: >60 mL/min (ref >60)
Glucose, Bld: 278 mg/dL — ABNORMAL HIGH (ref 70–99)
Potassium: 4.7 mmol/L (ref 3.5–5.1)
Sodium: 128 mmol/L — ABNORMAL LOW (ref 135–145)

## 2023-03-11 LAB — TROPONIN I (HIGH SENSITIVITY)
Troponin I (High Sensitivity): 6 ng/L (ref <18)
Troponin I (High Sensitivity): 6 ng/L (ref <18)

## 2023-03-11 LAB — CBC
HCT: 44.9 % (ref 39.0–52.0)
Hemoglobin: 15.3 g/dL (ref 13.0–17.0)
MCH: 30.4 pg (ref 26.0–34.0)
MCHC: 34.1 g/dL (ref 30.0–36.0)
MCV: 89.3 fL (ref 80.0–100.0)
Platelets: 146 10*3/uL — ABNORMAL LOW (ref 150–400)
RBC: 5.03 MIL/uL (ref 4.22–5.81)
RDW: 12.7 % (ref 11.5–15.5)
WBC: 11.8 10*3/uL — ABNORMAL HIGH (ref 4.0–10.5)
nRBC: 0 % (ref 0.0–0.2)

## 2023-03-11 LAB — BRAIN NATRIURETIC PEPTIDE: B Natriuretic Peptide: 34 pg/mL (ref 0.0–100.0)

## 2023-03-11 MED ORDER — IOHEXOL 350 MG/ML SOLN
75.0000 mL | Freq: Once | INTRAVENOUS | Status: AC | PRN
  Administered 2023-03-11: 75 mL via INTRAVENOUS

## 2023-03-11 MED ORDER — ASPIRIN 81 MG PO CHEW
324.0000 mg | CHEWABLE_TABLET | Freq: Once | ORAL | Status: AC
  Administered 2023-03-11: 324 mg via ORAL
  Filled 2023-03-11: qty 4

## 2023-03-11 MED ORDER — MORPHINE SULFATE (PF) 4 MG/ML IV SOLN
4.0000 mg | Freq: Once | INTRAVENOUS | Status: AC
  Administered 2023-03-11: 4 mg via INTRAVENOUS
  Filled 2023-03-11: qty 1

## 2023-03-11 MED ORDER — ONDANSETRON HCL 4 MG/2ML IJ SOLN
4.0000 mg | Freq: Once | INTRAMUSCULAR | Status: AC
  Administered 2023-03-11: 4 mg via INTRAVENOUS
  Filled 2023-03-11: qty 2

## 2023-03-11 MED ORDER — MORPHINE SULFATE (PF) 4 MG/ML IV SOLN
4.0000 mg | Freq: Once | INTRAVENOUS | Status: AC
Start: 2023-03-11 — End: 2023-03-11
  Administered 2023-03-11: 4 mg via INTRAVENOUS
  Filled 2023-03-11: qty 1

## 2023-03-11 NOTE — ED Provider Notes (Signed)
  Provider Note MRN:  [id number]  Arrival date & time: 03/12/23    ED Course and Medical Decision Making  Assumed care from PA Tripplett at shift change.  Chest pain, high heart score, some oxygen desaturation when standing.  Cardiology recommending admission here at [facility] for cardiology evaluation in the morning.  Procedures  Final Clinical Impressions(s) / ED Diagnoses     ICD-10-CM   1. Chest pain, unspecified type  R07.9       ED Discharge Orders     None       Discharge Instructions   None     Elmer Sow. Pilar Plate, MD [facility] Emergency [facility] [email or web address]    Sabas Sous, MD 03/12/23 [phone]

## 2023-03-11 NOTE — ED Triage Notes (Signed)
Pt c/o mid sternal chest pain that started yesterday after lunch and has gotten progressively worse  Pt also c/o sob and a non-productive cough

## 2023-03-11 NOTE — ED Provider Notes (Signed)
[place] EMERGENCY DEPARTMENT AT [facility] Provider Note   CSN: [id number] Arrival date & time: 03/11/23  1015     History  Chief Complaint  Patient presents with   Chest Pain    Manuel [name] is a 67 y.o. male with a history including hypertension, hypercholesterolemia, chronic kidney disease and type 2 diabetes presenting for evaluation of midsternal chest pain which started yesterday after eating lunch.  He describes constant pain which started out suddenly after eating, but mild discomfort but constant over the past 24 hours.  He also endorses a nonproductive cough and has noted new shortness of breath with exertion also since ytd.  He denies palpitations, diaphoresis, denies abdominal pain and has had no documented fevers or URI type symptoms.  He has noticed his pain seems to worsen with certain positions, for example twisting his torso to the left seems to exacerbate his pain.  Pain is not worse with deep inspiration.  Has had no treatment for his symptoms prior to arrival, he does take a baby aspirin daily and took 1 this morning.  He does have chronic bilateral peripheral edema which he states is not worsened today. Denies unilateral leg pain or swelling.  The history is provided by the spouse and the patient.       Home Medications Prior to Admission medications   Medication Sig Start Date End Date Taking? Authorizing Provider  alfuzosin (UROXATRAL) 10 MG 24 hr tablet Take 10 mg by mouth at bedtime. 12/28/22  Yes [provider]  allopurinol (ZYLOPRIM) 300 MG tablet Take 300 mg by mouth daily.   Yes [provider]  amLODipine (NORVASC) 5 MG tablet Take 5 mg by mouth daily. 01/22/22  Yes [provider]  aspirin (ASPIRIN EC) 81 MG EC tablet Take 81 mg by mouth daily. Swallow whole. 01/15/17  Yes Rehman, Joline Maxcy, MD  atenolol (TENORMIN) 25 MG tablet Take 25 mg by mouth daily.   Yes [provider]  DULoxetine (CYMBALTA) 30  MG capsule Take 30 mg by mouth daily. 03/19/22  Yes [provider]  losartan (COZAAR) 100 MG tablet Take 100 mg by mouth daily.   Yes [provider]  meloxicam (MOBIC) 7.5 MG tablet Take 1 tablet by mouth once daily 02/18/23  Yes Vickki Hearing, MD  metFORMIN (GLUCOPHAGE) 500 MG tablet Take 1 tablet (500 mg total) by mouth 2 (two) times daily with a meal. Patient taking differently: Take 500 mg by mouth daily with breakfast. 07/04/21  Yes Particia Nearing, PA-C  [facility] 5 MG/0.5ML Pen Inject 5 mg into the skin once a week. 03/07/23  Yes [provider]  pregabalin (LYRICA) 300 MG capsule Take 300 mg by mouth 2 (two) times daily. 03/23/22  Yes [provider]  rosuvastatin (CRESTOR) 5 MG tablet Take 5 mg by mouth daily. 03/07/22  Yes [provider]  silodosin (RAPAFLO) 8 MG CAPS capsule Take 8 mg by mouth at bedtime. 02/18/23  Yes [provider]  tamsulosin (FLOMAX) 0.4 MG CAPS capsule Take 1 capsule (0.4 mg total) by mouth daily after supper. 08/16/22  Yes [name], [name], MD  triamcinolone cream (KENALOG) 0.1 % Apply 1 Application topically 2 (two) times daily. Patient not taking: Reported on 03/11/2023 03/07/23   [provider]      Allergies    Patient has no known allergies.    Review of Systems   Review of Systems  Constitutional:  Negative for diaphoresis and fever.  HENT:  Negative for congestion and sore throat.   Eyes: Negative.   Respiratory:  Positive for cough and shortness of breath. Negative for chest tightness.   Cardiovascular:  Positive for chest pain.  Gastrointestinal:  Negative for abdominal pain, nausea and vomiting.  Genitourinary: Negative.   Musculoskeletal:  Negative for arthralgias, joint swelling and neck pain.  Skin: Negative.  Negative for rash and wound.  Neurological:  Negative for dizziness, weakness, light-headedness, numbness and headaches.  Psychiatric/Behavioral:  Negative.      Physical Exam Updated Vital Signs BP (!) 142/82   Pulse 94   Temp 98.7 F (37.1 C)   Resp 20   Ht 6\' 2"  (1.88 m)   Wt (!) 156 kg   SpO2 97%   BMI 44.17 kg/m  Physical Exam Vitals and nursing note reviewed.  Constitutional:      Appearance: He is well-developed.  HENT:     Head: Normocephalic and atraumatic.  Eyes:     Conjunctiva/sclera: Conjunctivae normal.  Cardiovascular:     Rate and Rhythm: Normal rate and regular rhythm.     Heart sounds: Normal heart sounds.  Pulmonary:     Effort: Pulmonary effort is normal. No tachypnea.     Breath sounds: Normal breath sounds. No wheezing, rhonchi or rales.     Comments: Noted that oxygen saturation drops to 86-88% briefly when he responds to questions.  Answers in full sentences.  No wheezing present. Abdominal:     General: Bowel sounds are normal.     Palpations: Abdomen is soft.     Tenderness: There is no abdominal tenderness.  Musculoskeletal:        General: Normal range of motion.     Cervical back: Normal range of motion.     Right lower leg: No tenderness. Edema present.     Left lower leg: No tenderness. Edema present.     Comments: No calf tenderness, negative Homan's  Skin:    General: Skin is warm and dry.     Comments: Patch of erythema right anterior mid tibia which is nontender.    Neurological:     Mental Status: He is alert.     ED Results / Procedures / Treatments   Labs (all labs ordered are listed, but only abnormal results are displayed) Labs Reviewed  BASIC METABOLIC PANEL - Abnormal; Notable for the following components:      Result Value   Sodium 128 (*)    Chloride 96 (*)    Glucose, Bld 278 (*)    All other components within normal limits  CBC - Abnormal; Notable for the following components:   WBC 11.8 (*)    Platelets 146 (*)    All other components within normal limits  BRAIN NATRIURETIC PEPTIDE  TROPONIN I (HIGH SENSITIVITY)  TROPONIN I (HIGH SENSITIVITY)     EKG None  Radiology CT Angio Chest PE W and/or Wo Contrast Result Date: 03/11/2023 CLINICAL DATA:  Midsternal chest pain since yesterday afternoon with shortness of breath and nonproductive cough. EXAM: CT ANGIOGRAPHY CHEST WITH CONTRAST TECHNIQUE: Multidetector CT imaging of the chest was performed using the standard protocol during bolus administration of intravenous contrast. Multiplanar CT image reconstructions and MIPs were obtained to evaluate the vascular anatomy. RADIATION DOSE REDUCTION: This exam was performed according to the departmental dose-optimization program which includes automated exposure control, adjustment of the mA and/or kV according to patient size and/or use of iterative reconstruction technique. CONTRAST:  75mL OMNIPAQUE IOHEXOL 350 MG/ML  SOLN COMPARISON:  Chest radiographs obtained earlier today. Chest CT dated 10/13/2022. FINDINGS: Cardiovascular: Atheromatous calcifications, including the coronary arteries and aorta. Normally opacified pulmonary arteries with no pulmonary arterial filling defects seen. Mildly enlarged heart. Prominent epicardial fat. No pericardial effusion. Mediastinum/Nodes: Stable to slightly increased enlarged right hilar lymph nodes, the largest with a short axis diameter of 20 mm on image number 123/5, previously 19 mm. Mild increase in size of an enlarged left hilar lymph node with a short axis diameter of 13 mm on image number 136/5, previously 10 mm. Mildly enlarged and borderline enlarged mediastinal nodes. The largest is a right paratracheal node with a short axis diameter of 11 mm on image number 57/5, previously 8 mm. Unremarkable thyroid gland, trachea and esophagus. Lungs/Pleura: Very small right pleural effusion and minimal left pleural effusion. Mild bilateral dependent atelectasis. Upper Abdomen: Progressive diffuse low density of the liver. 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 Review  of the MIP images confirms the above findings. IMPRESSION: 1. No pulmonary emboli. 2. Very small right pleural effusion and minimal left pleural effusion. 3. Mild bilateral dependent atelectasis. 4. Mild cardiomegaly. 5. Progressive diffuse hepatic steatosis. 6.  Calcific coronary artery and aortic atherosclerosis. 7. Mild mediastinal and bilateral hilar adenopathy with minimal progression, nonspecific. Aortic Atherosclerosis (ICD10-I70.0). Electronically Signed   By: Beckie Salts M.D.   On: 03/11/2023 18:50   DG Chest 2 View Result Date: 03/11/2023 CLINICAL DATA:  Chest pain EXAM: CHEST - 2 VIEW COMPARISON:  X-ray 03/04/2022 and older.  CT 10/13/2022 and older FINDINGS: No consolidation, pneumothorax or effusion. No edema. Normal cardiopericardial silhouette. Degenerative changes along the spine. Films are under penetrated. IMPRESSION: Under penetrated radiographs.  No acute cardiopulmonary disease Electronically Signed   By: Karen Kays M.D.   On: 03/11/2023 13:12    Procedures Procedures    Medications Ordered in ED Medications  aspirin chewable tablet 324 mg (324 mg Oral Given 03/11/23 1400)  morphine (PF) 4 MG/ML injection 4 mg (4 mg Intravenous Given 03/11/23 1716)  ondansetron (ZOFRAN) injection 4 mg (4 mg Intravenous Given 03/11/23 1716)  iohexol (OMNIPAQUE) 350 MG/ML injection 75 mL (75 mLs Intravenous Contrast Given 03/11/23 1654)    ED Course/ Medical Decision Making/ A&P                                 Medical Decision Making Patient presenting with a 24-hour history of persistent chest pain along with decreased exercise tolerance with shortness of breath and episodic hypoxia.  He has significant risk factors for CAD and unstable angina including hypertension, diabetes, hypercholesterolemia and he has a heart score of 6.  He has had negative delta troponin, he does have an EKG with new Q waves in his inferior leads since July, CT angio chest is negative for PE, however it does  reflect calcified coronary vessels.  I am concerned symptoms reflecting  unstable angina and should come in for cardiac rule out/cardiology consult in the morning.   Amount and/or Complexity of Data Reviewed Labs: ordered.    Details: Negative delta Troponins, his BNP is normal at 34, his be met has a glucose of 278 without an anion gap, his CO2 is normal at 24, he does have a moderate hyponatremia with a sodium of 128, WBC count 11.8. Radiology: ordered.    Details: Chest x-ray without obvious cardiopulmonary disease.  CT angio chest negative for  PE, this study does confirm calcific coronary artery and aortic atherosclerosis. Discussion of management or test interpretation with external provider(s): Call placed to the hospitalist for admission.  Discussed with Dr. Dareen Piano who has asked for cardiology input prior to admitting.  Call placed to cardiology to discuss case.   8:32 PM Still pending call back from cardiology.  Pt signed out to [employer] PA-C who will arrange disposition.  Risk Decision regarding hospitalization.           Final Clinical Impression(s) / ED Diagnoses Final diagnoses:  Chest pain, unspecified type    Rx / DC Orders ED Discharge Orders     None         Victoriano Lain 03/11/23 2034    Bethann Berkshire, MD 03/12/23 [phone]

## 2023-03-11 NOTE — ED Provider Notes (Signed)
   Patient signed out to me by Manuel Amor, PA-C pending consultation with cardiology  Patient with several risk factors for coronary artery disease presented here for substernal chest discomfort that began yesterday.  Also noted to have mild nonproductive cough and exertional dyspnea.  Reported to desat into the upper 80s with exertion and speech.   See previous provider note for complete H&P  Workup today had negative PE study but shows coronary calcifications no acute findings on chest x-ray patient was noted to have heart score of 6 and new Q waves in the inferior leads.  It was felt that patient would benefit from hospital admission for further cardiac workup.  Hospitalist was consulted and requested that cardiology be consulted.  ER physician, Dr. Posey Rea, spoke with cardiology, Dr. Geraldo Pitter, patient was felt to be appropriate for admission here at [facility] with eval by cards tomorrow.   Waiting hospitalist consult at end of shift  Discussed with overnight provider, Dr. Pilar Plate who agrees to consult with hospitalist regarding admission     [name] 03/11/23 2339    [name], MD 03/12/23 1513

## 2023-03-11 NOTE — ED Provider Triage Note (Signed)
Emergency Medicine Provider Triage Evaluation Note  Manuel [name] , a 67 y.o. male  was evaluated in triage.  Pt complains of midsternal chest pain with shortness of breath which started 24 hours ago after eating lunch.  His symptoms have progressively worsened without improvement.  He has had a nonproductive cough, denies diaphoresis, palpitations, nausea or vomiting, abdominal pain.Marland Kitchen  He does have bilateral lower extremity edema which he endorses is worse than his baseline.  Review of Systems  Positive: Shortness of breath and chest pain Negative: Abdominal pain, nausea vomiting, diaphoresis.  Physical Exam  BP (!) 150/86   Pulse 91   Temp 98.7 F (37.1 C) (Oral)   Resp 20   Ht 6\' 2"  (1.88 m)   Wt (!) 156 kg   SpO2 92%   BMI 44.17 kg/m  Gen:   Awake, no distress   Resp:  Normal effort  MSK:   Moves extremities without difficulty  Other:  2+ pitting edema bilateral ankles.  Medical Decision Making  Medically screening exam initiated at 11:13 AM.  Appropriate orders placed.  Manuel [name] was informed that the remainder of the evaluation will be completed by another provider, this initial triage assessment does not replace that evaluation, and the importance of remaining in the ED until their evaluation is complete.     Burgess Amor, PA-C 03/11/23 1114

## 2023-03-12 ENCOUNTER — Observation Stay (HOSPITAL_COMMUNITY): Payer: Medicare HMO

## 2023-03-12 ENCOUNTER — Other Ambulatory Visit (HOSPITAL_COMMUNITY): Payer: Self-pay | Admitting: *Deleted

## 2023-03-12 DIAGNOSIS — R011 Cardiac murmur, unspecified: Secondary | ICD-10-CM

## 2023-03-12 DIAGNOSIS — I471 Supraventricular tachycardia, unspecified: Secondary | ICD-10-CM | POA: Diagnosis not present

## 2023-03-12 DIAGNOSIS — I10 Essential (primary) hypertension: Secondary | ICD-10-CM | POA: Diagnosis not present

## 2023-03-12 DIAGNOSIS — R079 Chest pain, unspecified: Secondary | ICD-10-CM | POA: Diagnosis not present

## 2023-03-12 DIAGNOSIS — R0602 Shortness of breath: Secondary | ICD-10-CM | POA: Diagnosis not present

## 2023-03-12 DIAGNOSIS — R0789 Other chest pain: Secondary | ICD-10-CM

## 2023-03-12 LAB — LIPID PANEL
Cholesterol: 96 mg/dL (ref 0–200)
HDL: 61 mg/dL (ref >40)
LDL Cholesterol: 19 mg/dL (ref 0–99)
Total CHOL/HDL Ratio: 1.6 {ratio}
Triglycerides: 81 mg/dL (ref <150)
VLDL: 16 mg/dL (ref 0–40)

## 2023-03-12 LAB — CBC
HCT: 40.1 % (ref 39.0–52.0)
Hemoglobin: 13.1 g/dL (ref 13.0–17.0)
MCH: 29.8 pg (ref 26.0–34.0)
MCHC: 32.7 g/dL (ref 30.0–36.0)
MCV: 91.1 fL (ref 80.0–100.0)
Platelets: 117 10*3/uL — ABNORMAL LOW (ref 150–400)
RBC: 4.4 MIL/uL (ref 4.22–5.81)
RDW: 12.7 % (ref 11.5–15.5)
WBC: 13.4 10*3/uL — ABNORMAL HIGH (ref 4.0–10.5)
nRBC: 0 % (ref 0.0–0.2)

## 2023-03-12 LAB — ECHOCARDIOGRAM COMPLETE
AR max vel: 3.05 cm2
AV Area VTI: 3.13 cm2
AV Area mean vel: 3.3 cm2
AV Mean grad: 5.6 mm[Hg]
AV Peak grad: 10.5 mm[Hg]
Ao pk vel: 1.62 m/s
Area-P 1/2: 4.31 cm2
Height: 74 in
S' Lateral: 3 cm
Weight: 5569.7 [oz_av]

## 2023-03-12 LAB — BASIC METABOLIC PANEL
Anion gap: 8 (ref 5–15)
BUN: 18 mg/dL (ref 8–23)
CO2: 23 mmol/L (ref 22–32)
Calcium: 8.6 mg/dL — ABNORMAL LOW (ref 8.9–10.3)
Chloride: 98 mmol/L (ref 98–111)
Creatinine, Ser: 1.16 mg/dL (ref 0.61–1.24)
GFR, Estimated: >60 mL/min (ref >60)
Glucose, Bld: 201 mg/dL — ABNORMAL HIGH (ref 70–99)
Potassium: 3.9 mmol/L (ref 3.5–5.1)
Sodium: 129 mmol/L — ABNORMAL LOW (ref 135–145)

## 2023-03-12 LAB — GLUCOSE, CAPILLARY
Glucose-Capillary: 210 mg/dL — ABNORMAL HIGH (ref 70–99)
Glucose-Capillary: 268 mg/dL — ABNORMAL HIGH (ref 70–99)
Glucose-Capillary: 202 mg/dL — ABNORMAL HIGH (ref 70–99)
Glucose-Capillary: 248 mg/dL — ABNORMAL HIGH (ref 70–99)

## 2023-03-12 LAB — MAGNESIUM: Magnesium: 2 mg/dL (ref 1.7–2.4)

## 2023-03-12 LAB — HEPATIC FUNCTION PANEL
ALT: 29 U/L (ref 0–44)
AST: 22 U/L (ref 15–41)
Albumin: 3.1 g/dL — ABNORMAL LOW (ref 3.5–5.0)
Alkaline Phosphatase: 48 U/L (ref 38–126)
Bilirubin, Direct: 0.5 mg/dL — ABNORMAL HIGH (ref 0.0–0.2)
Indirect Bilirubin: 1.7 mg/dL — ABNORMAL HIGH (ref 0.3–0.9)
Total Bilirubin: 2.2 mg/dL — ABNORMAL HIGH (ref <1.2)
Total Protein: 6.8 g/dL (ref 6.5–8.1)

## 2023-03-12 LAB — HEMOGLOBIN A1C
Hgb A1c MFr Bld: 7.5 % — ABNORMAL HIGH (ref 4.8–5.6)
Mean Plasma Glucose: 168.55 mg/dL

## 2023-03-12 LAB — BRAIN NATRIURETIC PEPTIDE: B Natriuretic Peptide: 32 pg/mL (ref 0.0–100.0)

## 2023-03-12 LAB — HIV ANTIBODY (ROUTINE TESTING W REFLEX): HIV Screen 4th Generation wRfx: NONREACTIVE

## 2023-03-12 LAB — LIPASE, BLOOD: Lipase: 22 U/L (ref 11–51)

## 2023-03-12 LAB — PHOSPHORUS: Phosphorus: 4 mg/dL (ref 2.5–4.6)

## 2023-03-12 MED ORDER — ALLOPURINOL 300 MG PO TABS
300.0000 mg | ORAL_TABLET | Freq: Every day | ORAL | Status: DC
  Administered 2023-03-12 – 2023-03-14 (×3): 300 mg via ORAL
  Filled 2023-03-12: qty 1
  Filled 2023-03-12: qty 3
  Filled 2023-03-12: qty 1

## 2023-03-12 MED ORDER — INSULIN GLARGINE-YFGN 100 UNIT/ML ~~LOC~~ SOLN
12.0000 [IU] | Freq: Every day | SUBCUTANEOUS | Status: DC
  Administered 2023-03-12 – 2023-03-13 (×2): 12 [IU] via SUBCUTANEOUS
  Filled 2023-03-12 (×3): qty 0.12

## 2023-03-12 MED ORDER — ROSUVASTATIN CALCIUM 20 MG PO TABS: 20.0000 mg | ORAL_TABLET | Freq: Every evening | ORAL | Status: DC

## 2023-03-12 MED ORDER — THIAMINE MONONITRATE 100 MG PO TABS
100.0000 mg | ORAL_TABLET | Freq: Every day | ORAL | Status: DC
  Administered 2023-03-12 – 2023-03-14 (×3): 100 mg via ORAL
  Filled 2023-03-12 (×3): qty 1

## 2023-03-12 MED ORDER — LORAZEPAM 2 MG/ML IJ SOLN: 1.0000 mg | INTRAMUSCULAR | Status: DC | PRN

## 2023-03-12 MED ORDER — ROSUVASTATIN CALCIUM 10 MG PO TABS
5.0000 mg | ORAL_TABLET | Freq: Every evening | ORAL | Status: DC
Start: 2023-03-12 — End: 2023-03-14
  Administered 2023-03-12 – 2023-03-14 (×3): 5 mg via ORAL
  Filled 2023-03-12 (×3): qty 1

## 2023-03-12 MED ORDER — METOPROLOL TARTRATE 25 MG PO TABS
12.5000 mg | ORAL_TABLET | Freq: Two times a day (BID) | ORAL | Status: DC
  Filled 2023-03-12: qty 1

## 2023-03-12 MED ORDER — TAMSULOSIN HCL 0.4 MG PO CAPS
0.4000 mg | ORAL_CAPSULE | Freq: Every day | ORAL | Status: DC
  Administered 2023-03-12 – 2023-03-14 (×3): 0.4 mg via ORAL
  Filled 2023-03-12 (×3): qty 1

## 2023-03-12 MED ORDER — FUROSEMIDE 10 MG/ML IJ SOLN
40.0000 mg | Freq: Once | INTRAMUSCULAR | Status: AC
  Administered 2023-03-12: 40 mg via INTRAVENOUS
  Filled 2023-03-12: qty 4

## 2023-03-12 MED ORDER — POLYETHYLENE GLYCOL 3350 17 G PO PACK
17.0000 g | PACK | Freq: Every day | ORAL | Status: DC
  Administered 2023-03-12 – 2023-03-14 (×3): 17 g via ORAL
  Filled 2023-03-12 (×3): qty 1

## 2023-03-12 MED ORDER — PERFLUTREN LIPID MICROSPHERE
1.0000 mL | INTRAVENOUS | Status: AC | PRN
  Administered 2023-03-12: 4 mL via INTRAVENOUS

## 2023-03-12 MED ORDER — DULOXETINE HCL 30 MG PO CPEP
30.0000 mg | ORAL_CAPSULE | Freq: Every day | ORAL | Status: DC
  Administered 2023-03-12 – 2023-03-14 (×3): 30 mg via ORAL
  Filled 2023-03-12 (×3): qty 1

## 2023-03-12 MED ORDER — ONDANSETRON HCL 4 MG/2ML IJ SOLN: 4.0000 mg | Freq: Four times a day (QID) | INTRAMUSCULAR | Status: DC | PRN

## 2023-03-12 MED ORDER — ATENOLOL 25 MG PO TABS
25.0000 mg | ORAL_TABLET | Freq: Every day | ORAL | Status: DC
  Filled 2023-03-12: qty 1

## 2023-03-12 MED ORDER — POLYETHYLENE GLYCOL 3350 17 G PO PACK: 17.0000 g | PACK | Freq: Every day | ORAL | Status: DC | PRN

## 2023-03-12 MED ORDER — SODIUM CHLORIDE 0.9% FLUSH
3.0000 mL | Freq: Two times a day (BID) | INTRAVENOUS | Status: DC
  Administered 2023-03-12 – 2023-03-14 (×5): 3 mL via INTRAVENOUS

## 2023-03-12 MED ORDER — PREGABALIN 75 MG PO CAPS
300.0000 mg | ORAL_CAPSULE | Freq: Two times a day (BID) | ORAL | Status: DC
  Administered 2023-03-12 – 2023-03-14 (×6): 300 mg via ORAL
  Filled 2023-03-12 (×6): qty 4

## 2023-03-12 MED ORDER — PANTOPRAZOLE SODIUM 40 MG PO TBEC
40.0000 mg | DELAYED_RELEASE_TABLET | Freq: Two times a day (BID) | ORAL | Status: DC
  Administered 2023-03-12 – 2023-03-14 (×4): 40 mg via ORAL
  Filled 2023-03-12 (×4): qty 1

## 2023-03-12 MED ORDER — METOPROLOL TARTRATE 25 MG PO TABS
12.5000 mg | ORAL_TABLET | Freq: Two times a day (BID) | ORAL | Status: DC
  Administered 2023-03-12 – 2023-03-14 (×4): 12.5 mg via ORAL
  Filled 2023-03-12 (×3): qty 1

## 2023-03-12 MED ORDER — ADULT MULTIVITAMIN W/MINERALS CH
1.0000 | ORAL_TABLET | Freq: Every day | ORAL | Status: DC
  Administered 2023-03-12 – 2023-03-14 (×3): 1 via ORAL
  Filled 2023-03-12 (×3): qty 1

## 2023-03-12 MED ORDER — ENOXAPARIN SODIUM 80 MG/0.8ML IJ SOSY
75.0000 mg | PREFILLED_SYRINGE | INTRAMUSCULAR | Status: DC
  Administered 2023-03-12 – 2023-03-14 (×3): 75 mg via SUBCUTANEOUS
  Filled 2023-03-12 (×3): qty 0.8

## 2023-03-12 MED ORDER — INSULIN ASPART 100 UNIT/ML IJ SOLN
0.0000 [IU] | Freq: Three times a day (TID) | INTRAMUSCULAR | Status: DC
  Administered 2023-03-12 (×2): 2 [IU] via SUBCUTANEOUS
  Administered 2023-03-12: 3 [IU] via SUBCUTANEOUS
  Administered 2023-03-13 (×3): 2 [IU] via SUBCUTANEOUS
  Administered 2023-03-14: 5 [IU] via SUBCUTANEOUS
  Administered 2023-03-14: 3 [IU] via SUBCUTANEOUS
  Administered 2023-03-14: 2 [IU] via SUBCUTANEOUS

## 2023-03-12 MED ORDER — ATORVASTATIN CALCIUM 40 MG PO TABS: 40.0000 mg | ORAL_TABLET | Freq: Every day | ORAL | Status: DC

## 2023-03-12 MED ORDER — ONDANSETRON HCL 4 MG PO TABS: 4.0000 mg | ORAL_TABLET | Freq: Four times a day (QID) | ORAL | Status: DC | PRN

## 2023-03-12 MED ORDER — DOCUSATE SODIUM 100 MG PO CAPS
100.0000 mg | ORAL_CAPSULE | Freq: Two times a day (BID) | ORAL | Status: DC
  Administered 2023-03-12 – 2023-03-14 (×5): 100 mg via ORAL
  Filled 2023-03-12 (×5): qty 1

## 2023-03-12 MED ORDER — LORAZEPAM 1 MG PO TABS: 1.0000 mg | ORAL_TABLET | ORAL | Status: DC | PRN

## 2023-03-12 MED ORDER — ALUM & MAG HYDROXIDE-SIMETH 200-200-20 MG/5ML PO SUSP
30.0000 mL | Freq: Once | ORAL | Status: DC
  Filled 2023-03-12: qty 30

## 2023-03-12 MED ORDER — ASPIRIN 81 MG PO CHEW: 81.0000 mg | CHEWABLE_TABLET | Freq: Every day | ORAL | Status: DC

## 2023-03-12 MED ORDER — FOLIC ACID 1 MG PO TABS
1.0000 mg | ORAL_TABLET | Freq: Every day | ORAL | Status: DC
  Administered 2023-03-12 – 2023-03-14 (×3): 1 mg via ORAL
  Filled 2023-03-12 (×3): qty 1

## 2023-03-12 MED ORDER — ACETAMINOPHEN 500 MG PO TABS
1000.0000 mg | ORAL_TABLET | Freq: Four times a day (QID) | ORAL | Status: DC | PRN
  Administered 2023-03-12 – 2023-03-13 (×3): 1000 mg via ORAL
  Filled 2023-03-12 (×4): qty 2

## 2023-03-12 MED ORDER — THIAMINE HCL 100 MG/ML IJ SOLN: 100.0000 mg | Freq: Every day | INTRAMUSCULAR | Status: DC

## 2023-03-12 MED ORDER — PANTOPRAZOLE SODIUM 40 MG PO TBEC
40.0000 mg | DELAYED_RELEASE_TABLET | Freq: Every day | ORAL | Status: DC
  Administered 2023-03-12: 40 mg via ORAL
  Filled 2023-03-12: qty 1

## 2023-03-12 MED ORDER — ASPIRIN 81 MG PO TBEC
81.0000 mg | DELAYED_RELEASE_TABLET | Freq: Every day | ORAL | Status: DC
  Administered 2023-03-12 – 2023-03-14 (×3): 81 mg via ORAL
  Filled 2023-03-12 (×4): qty 1

## 2023-03-12 NOTE — Consult Note (Addendum)
Cardiology Consultation   Patient ID: Life Simic MRN: [id number]; DOB: Aug 02, 1955  Admit date: 03/11/2023 Date of Consult: 03/12/2023  PCP:  Kirstie Peri, MD   [facility] Providers Cardiologist: New [facility]  Patient Profile:   Manuel [name] is a 67 y.o. male with a hx of HTN, HLD, Type II DM and alcohol abuse who is being seen 03/12/2023 for the evaluation of chest pain at the request of Dr. Gwenlyn Perking.  History of Present Illness:   Manuel [name] presented to [facility] ED on 03/11/2023 for evaluation of chest pain which had started the day prior to arrival and had been constant ever since consuming lunch on 03/10/2023. In talking with the patient and his wife today, he reports developing chest discomfort on 03/10/2023 which occurred while he was sitting in his truck. Reports this was a discomfort/sharp pain along his epigastric region and did not radiate anywhere. No associated dyspnea, nausea, vomiting or diaphoresis. He does report being more short of breath with activity over the past several weeks. His pain has not been worse with exertion.  Reports it does improve with turning from side-to-side and then he has recurrent pain. Pain can also be exacerbated with coughing. His wife is concerned this is due to constipation as he has not had a bowel movement since Saturday and typically has daily bowel movements.  He is unaware of any personal history of CAD, CHF or cardiac arrhythmias. Says he was unaware he had a heart murmur until last night as his PCP had never mentioned this in the past. Reports a family history of cardiac issues with his father having atrial fibrillation and his mom having coronary artery disease. The patient does consume a significant amount of alcohol in the form of 8-10 16 ounce beers on a daily basis.   Initial labs showed WBC 11.8, Hgb 15.3, platelets 146, Na+ 128, K+ 4.7 and creatinine 1.10. BNP normal at 34. Initial and repeat Hs Troponin negative  6. Lipase normal at 22. CXR with no acute findings. CTA showed no evidence of a PE. Was noted to have a small right pleural effusion and minimal left effusion with mild atelectasis. Noted to have mild cardiomegaly, hepatic steatosis, coronary calcification and aortic atherosclerosis. EKG shows NSR, HR 91 with inferior infarct pattern and no acute ST changes.   Past Medical History:  Diagnosis Date   CKD (chronic kidney disease) 01/08/2017   patient states he does not have.   Diabetes mellitus without complication (HCC)    Essential hypertension, benign 01/08/2017   Gout    High cholesterol 01/08/2017   Hypertension    Obesity     Past Surgical History:  Procedure Laterality Date   CIRCUMCISION     COLONOSCOPY N/A 01/14/2017   Procedure: COLONOSCOPY;  Surgeon: Malissa Hippo, MD;  Location: [facility];  Service: Endoscopy;  Laterality: N/A;   CYSTOSCOPY WITH URETHRAL DILATATION N/A 03/28/2022   Procedure: CYSTOSCOPY WITH URETHRAL DILATATION;  Surgeon: Malen Gauze, MD;  Location: [facility] ORS;  Service: Urology;  Laterality: N/A;   DENTAL SURGERY     teeth removed     Home Medications:  Prior to Admission medications   Medication Sig Start Date End Date Taking? Authorizing Provider  alfuzosin (UROXATRAL) 10 MG 24 hr tablet Take 10 mg by mouth at bedtime. 12/28/22  Yes [provider]  allopurinol (ZYLOPRIM) 300 MG tablet Take 300 mg by mouth daily.   Yes [provider]  amLODipine (NORVASC) 5  MG tablet Take 5 mg by mouth daily. 01/22/22  Yes [provider]  aspirin (ASPIRIN EC) 81 MG EC tablet Take 81 mg by mouth daily. Swallow whole. 01/15/17  Yes Rehman, Joline Maxcy, MD  atenolol (TENORMIN) 25 MG tablet Take 25 mg by mouth daily.   Yes [provider]  DULoxetine (CYMBALTA) 30 MG capsule Take 30 mg by mouth daily. 03/19/22  Yes [provider]  losartan (COZAAR) 100 MG tablet Take 100 mg by mouth daily.   Yes [provider]   meloxicam (MOBIC) 7.5 MG tablet Take 1 tablet by mouth once daily 02/18/23  Yes Vickki Hearing, MD  metFORMIN (GLUCOPHAGE) 500 MG tablet Take 1 tablet (500 mg total) by mouth 2 (two) times daily with a meal. Patient taking differently: Take 500 mg by mouth daily with breakfast. 07/04/21  Yes Particia Nearing, PA-C  [facility] 5 MG/0.5ML Pen Inject 5 mg into the skin once a week. 03/07/23  Yes [provider]  pregabalin (LYRICA) 300 MG capsule Take 300 mg by mouth 2 (two) times daily. 03/23/22  Yes [provider]  rosuvastatin (CRESTOR) 5 MG tablet Take 5 mg by mouth daily. 03/07/22  Yes [provider]  silodosin (RAPAFLO) 8 MG CAPS capsule Take 8 mg by mouth at bedtime. 02/18/23  Yes [provider]  tamsulosin (FLOMAX) 0.4 MG CAPS capsule Take 1 capsule (0.4 mg total) by mouth daily after supper. 08/16/22  Yes [name], [name], MD  triamcinolone cream (KENALOG) 0.1 % Apply 1 Application topically 2 (two) times daily. Patient not taking: Reported on 03/11/2023 03/07/23   [provider]    Inpatient Medications: Scheduled Meds:  allopurinol  300 mg Oral Daily   alum & mag hydroxide-simeth  30 mL Oral Once   aspirin EC  81 mg Oral Daily   atenolol  25 mg Oral Daily   docusate sodium  100 mg Oral BID   DULoxetine  30 mg Oral Daily   enoxaparin (LOVENOX) injection  75 mg Subcutaneous Q24H   folic acid  1 mg Oral Daily   insulin aspart  0-6 Units Subcutaneous TID WC   multivitamin with minerals  1 tablet Oral Daily   pantoprazole  40 mg Oral BID   polyethylene glycol  17 g Oral Daily   pregabalin  300 mg Oral BID   rosuvastatin  20 mg Oral QPM   sodium chloride flush  3 mL Intravenous Q12H   tamsulosin  0.4 mg Oral QPC supper   thiamine  100 mg Oral Daily   Or   thiamine  100 mg Intravenous Daily   Continuous Infusions:  PRN Meds: acetaminophen, LORazepam **OR** LORazepam, ondansetron **OR** ondansetron (ZOFRAN)  IV  Allergies:   No Known Allergies  Social History:   Social History   Socioeconomic History   Marital status: Married    Spouse name: Not on file   Number of children: Not on file   Years of education: Not on file   Highest education level: Not on file  Occupational History   Not on file  Tobacco Use   Smoking status: Never    Passive exposure: Never   Smokeless tobacco: Never  Vaping Use   Vaping status: Never Used  Substance and Sexual Activity   Alcohol use: Yes    Comment: a couple of beers a day   Drug use: No   Sexual activity: Yes    Birth control/protection: None  Other Topics Concern  Not on file  Social History Narrative   Not on file   Social Drivers of Health   Financial Resource Strain: Not on file  Food Insecurity: No Food Insecurity (03/12/2023)   Hunger Vital Sign    Worried About Running Out of Food in the Last Year: Never true    Ran Out of Food in the Last Year: Never true  Transportation Needs: No Transportation Needs (03/12/2023)   PRAPARE - Administrator, Civil Service (Medical): No    Lack of Transportation (Non-Medical): No  Physical Activity: Not on file  Stress: Not on file  Social Connections: Not on file  Intimate Partner Violence: Not At Risk (03/12/2023)   Humiliation, Afraid, Rape, and Kick questionnaire    Fear of Current or Ex-Partner: No    Emotionally Abused: No    Physically Abused: No    Sexually Abused: No    Family History:    Family History  Problem Relation Age of Onset   Colon cancer Brother      ROS:  Please see the history of present illness.   All other ROS reviewed and negative.     Physical Exam/Data:   Vitals:   03/12/23 0121 03/12/23 0500 03/12/23 0614 03/12/23 0858  BP: 114/70  108/61 (!) 94/45  Pulse: 81  92 80  Resp: 20  20 20   Temp: 97.8 F (36.6 C)  98.5 F (36.9 C) 98.6 F (37 C)  TempSrc: Oral   Oral  SpO2: 97%  94% 94%  Weight:  (!) 157.9 kg    Height:         Intake/Output Summary (Last 24 hours) at 03/12/2023 1109 Last data filed at 03/12/2023 0900 Gross per 24 hour  Intake 465 ml  Output --  Net 465 ml      03/12/2023    5:00 AM 03/11/2023   10:40 AM 10/13/2022    8:58 PM  Last 3 Weights  Weight (lbs) 348 lb 1.7 oz 344 lb 315 lb  Weight (kg) 157.9 kg 156.037 kg 142.883 kg     Body mass index is 44.69 kg/m.  General: Obese male appearing in no acute distress.  HEENT: normal Neck: no JVD Vascular: No carotid bruits; Distal pulses 2+ bilaterally Cardiac:  normal S1, S2; RRR; 2/6 systolic murmur most prominent along RUSB.  Lungs:  clear to auscultation bilaterally, no wheezing, rhonchi or rales  Abd: appears distended.  Ext: trace lower extremity edema bilaterally.  Musculoskeletal:  No deformities, BUE and BLE strength normal and equal Skin: warm and dry  Neuro:  CNs 2-12 intact, no focal abnormalities noted Psych:  Normal affect   EKG:  The EKG was personally reviewed and demonstrates: NSR, HR 91 with inferior infarct pattern and no acute ST changes.  Telemetry:  Telemetry was personally reviewed and demonstrates: NSR, HR in 70's to 90's.   Relevant CV Studies:  Echocardiogram: Pending  Laboratory Data:  High Sensitivity Troponin:   Recent Labs  Lab 03/11/23 1057 03/11/23 1233  TROPONINIHS 6 6     Chemistry Recent Labs  Lab 03/11/23 1057 03/12/23 0407  NA 128* 129*  K 4.7 3.9  CL 96* 98  CO2 24 23  GLUCOSE 278* 201*  BUN 11 18  CREATININE 1.10 1.16  CALCIUM 9.0 8.6*  MG  --  2.0  GFRNONAA >60 >60  ANIONGAP 8 8    Recent Labs  Lab 03/12/23 0407  PROT 6.8  ALBUMIN 3.1*  AST 22  ALT  29  ALKPHOS 48  BILITOT 2.2*   Lipids  Recent Labs  Lab 03/12/23 0407  CHOL 96  TRIG 81  HDL 61  LDLCALC 19  CHOLHDL 1.6    Hematology Recent Labs  Lab 03/11/23 1057 03/12/23 0407  WBC 11.8* 13.4*  RBC 5.03 4.40  HGB 15.3 13.1  HCT 44.9 40.1  MCV 89.3 91.1  MCH 30.4 29.8  MCHC 34.1 32.7  RDW 12.7  12.7  PLT 146* 117*   Thyroid No results for input(s): "TSH", "FREET4" in the last 168 hours.  BNP Recent Labs  Lab 03/11/23 1057 03/12/23 0408  BNP 34.0 32.0    DDimer No results for input(s): "DDIMER" in the last 168 hours.   Radiology/Studies:  CT Angio Chest PE W and/or Wo Contrast Result Date: 03/11/2023 CLINICAL DATA:  Midsternal chest pain since yesterday afternoon with shortness of breath and nonproductive cough. EXAM: CT ANGIOGRAPHY CHEST WITH CONTRAST TECHNIQUE: Multidetector CT imaging of the chest was performed using the standard protocol during bolus administration of intravenous contrast. Multiplanar CT image reconstructions and MIPs were obtained to evaluate the vascular anatomy. RADIATION DOSE REDUCTION: This exam was performed according to the departmental dose-optimization program which includes automated exposure control, adjustment of the mA and/or kV according to patient size and/or use of iterative reconstruction technique. CONTRAST:  75mL OMNIPAQUE IOHEXOL 350 MG/ML SOLN COMPARISON:  Chest radiographs obtained earlier today. Chest CT dated 10/13/2022. FINDINGS: Cardiovascular: Atheromatous calcifications, including the coronary arteries and aorta. Normally opacified pulmonary arteries with no pulmonary arterial filling defects seen. Mildly enlarged heart. Prominent epicardial fat. No pericardial effusion. Mediastinum/Nodes: Stable to slightly increased enlarged right hilar lymph nodes, the largest with a short axis diameter of 20 mm on image number 123/5, previously 19 mm. Mild increase in size of an enlarged left hilar lymph node with a short axis diameter of 13 mm on image number 136/5, previously 10 mm. Mildly enlarged and borderline enlarged mediastinal nodes. The largest is a right paratracheal node with a short axis diameter of 11 mm on image number 57/5, previously 8 mm. Unremarkable thyroid gland, trachea and esophagus. Lungs/Pleura: Very small right pleural effusion  and minimal left pleural effusion. Mild bilateral dependent atelectasis. Upper Abdomen: Progressive diffuse low density of the liver. 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 Review of the MIP images confirms the above findings. IMPRESSION: 1. No pulmonary emboli. 2. Very small right pleural effusion and minimal left pleural effusion. 3. Mild bilateral dependent atelectasis. 4. Mild cardiomegaly. 5. Progressive diffuse hepatic steatosis. 6.  Calcific coronary artery and aortic atherosclerosis. 7. Mild mediastinal and bilateral hilar adenopathy with minimal progression, nonspecific. Aortic Atherosclerosis (ICD10-I70.0). Electronically Signed   By: Beckie Salts M.D.   On: 03/11/2023 18:50   DG Chest 2 View Result Date: 03/11/2023 CLINICAL DATA:  Chest pain EXAM: CHEST - 2 VIEW COMPARISON:  X-ray 03/04/2022 and older.  CT 10/13/2022 and older FINDINGS: No consolidation, pneumothorax or effusion. No edema. Normal cardiopericardial silhouette. Degenerative changes along the spine. Films are under penetrated. IMPRESSION: Under penetrated radiographs.  No acute cardiopulmonary disease Electronically Signed   By: Karen Kays M.D.   On: 03/11/2023 13:12     Assessment and Plan:   1. Atypical Chest Pain/Coronary Calcification by CT - He presents for evaluation of epigastric pain which has now been constant for almost 48 hours and there is no association with exertion. His pain does improve with rolling from side-to-side and is worse with inspiration  at times. Hs troponin values have been negative and EKG is without acute ST changes. Overall, his pain appears atypical for a cardiac etiology.   - An echocardiogram is pending to assess for any structural abnormalities as he is at risk for an alcohol-induced cardiomyopathy and he also has a murmur on examination as discussed below. Further recommendations pending echocardiogram results. Once BP improves, would also  consider dosing IV Lasix x 1 to assess response as he does have some fluid accumulation by examination. Continue ASA and Crestor given coronary calcification.   2. Systolic Murmur - Noted on examination today and he is unaware of any known murmur prior to this admission. Echocardiogram is pending for further assessment.  3. HTN - BP has actually been soft this admission which has led to Amlodipine, Losartan and Atenolol currently being held.  4. HLD - FLP this admission shows LDL is at 19. Remains on Crestor 5 mg daily.  5. Hyponatremia - Appears chronic by review of prior labs. Na+ was at 128 on admission and improved to 129 today. Further workup per the admitting team.  6. Alcohol Abuse - He reports consuming 8-10 16 ounce beers on a daily basis. We reviewed the risks of this and cessation advised. He has been placed on CIWA protocol.   For questions or updates, please contact [facility] Please consult [email or web address] for contact info under    Signed, Ellsworth Lennox, PA-C  03/12/2023 11:09 AM  Attending note  Patient seen and discussed with PA Iran Ouch, I agree with her documentation. 67 yo male history of EtOH abuse, HTN, HLD, DM2 presents with chest pain. From H&P appeared to have started on Monday after eating lunch. Constant pain since onset, over 48 hours of pain at time of presentation.    K 4.7 Na 128 Cr 1.10 BUN 11 WBC 11.8 Hgb 15.3 Plt 146 BNP 34 LDL 19 Trop 6-->6 EKG SR, no ischemic changes CXR no acute process CT PE: no PE, coronary atherosclersosis      1.Noncardiac chest pains - symptmos not cardiac. Started after eating lunch, lasting over 48 hrs constant and has a positional component.  - no objective evidence of ischemia by EKG or enzymes - f/u echo, at this time no plans for ischemic testing  2. SOB/DOE/LE edema - some signs of fluid overload though difficult exam due to body habitus.  - BNP 34 may be underestimated given body habitus.  Very small pleural effusions on CT - check reds vest - dose IV lasix 40mg  x 1 - f/u echo  Dina Rich MD

## 2023-03-12 NOTE — Progress Notes (Signed)
Patient seen and examined; admitted after midnight secondary to chest pain.  Atypical presentation but with typical features.  Troponins negative and EKG without acute ischemic changes.  Case discussed with cardiology service who has recommended 2D echo, IV Lasix x 1 and the pending results decide further investigation/management.  Please refer to H&P written by Dr. Lazarus Salines for further info/details on admission.  Plan: -Follow cardiology recommendations -Follow 2D echo result -Heart healthy/low-sodium diet and modified carbohydrate discussed with patient -Alcohol cessation recommended -Patient instructed to follow low-calorie diet, control portion and increase physical activity.  Vassie Loll MD [phone]

## 2023-03-12 NOTE — H&P (Signed)
History and Physical    Manuel [name] UXL:[id number] DOB: August 27, 1955 DOA: 03/11/2023  PCP: Kirstie Peri, MD   Patient coming from: Home   Chief Complaint:  Chief Complaint  Patient presents with   Chest Pain    HPI:  Manuel [name] is a 67 y.o. male with hx of hypertension, hyperlipidemia, diabetes, alcohol abuse, who presents with sudden onset of epigastric pain /lower chest pain.  Reports sudden onset of symptoms Monday afternoon while he was at rest.  Did not bring this up on our interview but per initial EDP notes that this occurred after eating lunch.  Pain has been constant since onset.  Described as sharp.  Not worsened with p.o. intake.  Feels it is worse when he is up and moving/ambulating on flat ground, or twisting to the left.  Reports chronic dyspnea on exertion when walking short distances.  No orthopnea.  Does have chronic lower extremity edema. he denies any recent strenuous activity, lifting, other MSK injury.  Has not had similar symptoms in the past.  Denies taking OTC med/NSAIDs for pain.  Notably has heavy alcohol use at 8 beers per day, no history of prior withdrawals.   Review of Systems:  ROS complete and negative except as marked above   No Known Allergies  Prior to Admission medications   Medication Sig Start Date End Date Taking? Authorizing Provider  alfuzosin (UROXATRAL) 10 MG 24 hr tablet Take 10 mg by mouth at bedtime. 12/28/22  Yes [provider]  allopurinol (ZYLOPRIM) 300 MG tablet Take 300 mg by mouth daily.   Yes [provider]  amLODipine (NORVASC) 5 MG tablet Take 5 mg by mouth daily. 01/22/22  Yes [provider]  aspirin (ASPIRIN EC) 81 MG EC tablet Take 81 mg by mouth daily. Swallow whole. 01/15/17  Yes Rehman, Joline Maxcy, MD  atenolol (TENORMIN) 25 MG tablet Take 25 mg by mouth daily.   Yes [provider]  DULoxetine (CYMBALTA) 30 MG capsule Take 30 mg by mouth daily. 03/19/22  Yes [provider]   losartan (COZAAR) 100 MG tablet Take 100 mg by mouth daily.   Yes [provider]  meloxicam (MOBIC) 7.5 MG tablet Take 1 tablet by mouth once daily 02/18/23  Yes Vickki Hearing, MD  metFORMIN (GLUCOPHAGE) 500 MG tablet Take 1 tablet (500 mg total) by mouth 2 (two) times daily with a meal. Patient taking differently: Take 500 mg by mouth daily with breakfast. 07/04/21  Yes Particia Nearing, PA-C  [facility] 5 MG/0.5ML Pen Inject 5 mg into the skin once a week. 03/07/23  Yes [provider]  pregabalin (LYRICA) 300 MG capsule Take 300 mg by mouth 2 (two) times daily. 03/23/22  Yes [provider]  rosuvastatin (CRESTOR) 5 MG tablet Take 5 mg by mouth daily. 03/07/22  Yes [provider]  silodosin (RAPAFLO) 8 MG CAPS capsule Take 8 mg by mouth at bedtime. 02/18/23  Yes [provider]  tamsulosin (FLOMAX) 0.4 MG CAPS capsule Take 1 capsule (0.4 mg total) by mouth daily after supper. 08/16/22  Yes [name], [name], MD  triamcinolone cream (KENALOG) 0.1 % Apply 1 Application topically 2 (two) times daily. Patient not taking: Reported on 03/11/2023 03/07/23   [provider]    Past Medical History:  Diagnosis Date   CKD (chronic kidney disease) 01/08/2017   patient states he does not have.   Diabetes mellitus without complication (HCC)    Essential hypertension, benign 01/08/2017  Gout    High cholesterol 01/08/2017   Hypertension    Obesity     Past Surgical History:  Procedure Laterality Date   CIRCUMCISION     COLONOSCOPY N/A 01/14/2017   Procedure: COLONOSCOPY;  Surgeon: Malissa Hippo, MD;  Location: [facility];  Service: Endoscopy;  Laterality: N/A;   CYSTOSCOPY WITH URETHRAL DILATATION N/A 03/28/2022   Procedure: CYSTOSCOPY WITH URETHRAL DILATATION;  Surgeon: Malen Gauze, MD;  Location: [facility] ORS;  Service: Urology;  Laterality: N/A;   DENTAL SURGERY     teeth removed     reports that he has never  smoked. He has never been exposed to tobacco smoke. He has never used smokeless tobacco. He reports current alcohol use. He reports that he does not use drugs.  Family History  Problem Relation Age of Onset   Colon cancer Brother      Physical Exam: Vitals:   03/11/23 2115 03/11/23 2330 03/12/23 0045 03/12/23 0121  BP: 129/78 99/70 128/76 114/70  Pulse: 79 81 74 81  Resp: 18  16 20   Temp:    97.8 F (36.6 C)  TempSrc:    Oral  SpO2: 97% 91% 95% 97%  Weight:      Height:        Gen: Awake, alert, NAD   CV: Regular, normal S1, S2, no murmurs  Resp: Normal WOB, CTAB  Abd: Flat, normoactive, nontender MSK: Symmetric, no edema  Skin: No rashes or lesions to exposed skin  Neuro: Alert and interactive  Psych: euthymic, appropriate    Data review:   Labs reviewed, notable for:   NA 128, glucose 278 BNP 34 Trop 6 x 2 WBC 11, platelet 146  Micro:  Results for orders placed or performed during the hospital encounter of 03/04/22  Resp Panel by RT-PCR (Flu A&B, Covid) Anterior Nasal Swab     Status: None   Collection Time: 03/04/22  1:20 AM   Specimen: Anterior Nasal Swab  Result Value Ref Range Status   SARS Coronavirus 2 by RT PCR NEGATIVE NEGATIVE Final    Comment: (NOTE) SARS-CoV-2 target nucleic acids are NOT DETECTED.  The SARS-CoV-2 RNA is generally detectable in upper respiratory specimens during the acute phase of infection. The lowest concentration of SARS-CoV-2 viral copies this assay can detect is 138 copies/mL. A negative result does not preclude SARS-Cov-2 infection and should not be used as the sole basis for treatment or other patient management decisions. A negative result may occur with  improper specimen collection/handling, submission of specimen other than nasopharyngeal swab, presence of viral mutation(s) within the areas targeted by this assay, and inadequate number of viral copies(<138 copies/mL). A negative result must be combined with clinical  observations, patient history, and epidemiological information. The expected result is Negative.  Fact Sheet for Patients:  BloggerCourse.com  Fact Sheet for Healthcare Providers:  SeriousBroker.it  This test is no t yet approved or cleared by the [place] FDA and  has been authorized for detection and/or diagnosis of SARS-CoV-2 by FDA under an Emergency Use Authorization (EUA). This EUA will remain  in effect (meaning this test can be used) for the duration of the COVID-19 declaration under Section 564(b)(1) of the Act, 21 U.S.C.section 360bbb-3(b)(1), unless the authorization is terminated  or revoked sooner.       Influenza A by PCR NEGATIVE NEGATIVE Final   Influenza B by PCR NEGATIVE NEGATIVE Final    Comment: (NOTE) The Xpert Xpress SARS-CoV-2/FLU/RSV plus assay is intended  as an aid in the diagnosis of influenza from Nasopharyngeal swab specimens and should not be used as a sole basis for treatment. Nasal washings and aspirates are unacceptable for Xpert Xpress SARS-CoV-2/FLU/RSV testing.  Fact Sheet for Patients: BloggerCourse.com  Fact Sheet for Healthcare Providers: SeriousBroker.it  This test is not yet approved or cleared by the [place] FDA and has been authorized for detection and/or diagnosis of SARS-CoV-2 by FDA under an Emergency Use Authorization (EUA). This EUA will remain in effect (meaning this test can be used) for the duration of the COVID-19 declaration under Section 564(b)(1) of the Act, 21 U.S.C. section 360bbb-3(b)(1), unless the authorization is terminated or revoked.  Performed at [facility], [address] [place], [place] [address]     Imaging reviewed:  CT Angio Chest PE W and/or Wo Contrast Result Date: 03/11/2023 CLINICAL DATA:  Midsternal chest pain since yesterday afternoon with shortness of breath and nonproductive cough.  EXAM: CT ANGIOGRAPHY CHEST WITH CONTRAST TECHNIQUE: Multidetector CT imaging of the chest was performed using the standard protocol during bolus administration of intravenous contrast. Multiplanar CT image reconstructions and MIPs were obtained to evaluate the vascular anatomy. RADIATION DOSE REDUCTION: This exam was performed according to the departmental dose-optimization program which includes automated exposure control, adjustment of the mA and/or kV according to patient size and/or use of iterative reconstruction technique. CONTRAST:  75mL OMNIPAQUE IOHEXOL 350 MG/ML SOLN COMPARISON:  Chest radiographs obtained earlier today. Chest CT dated 10/13/2022. FINDINGS: Cardiovascular: Atheromatous calcifications, including the coronary arteries and aorta. Normally opacified pulmonary arteries with no pulmonary arterial filling defects seen. Mildly enlarged heart. Prominent epicardial fat. No pericardial effusion. Mediastinum/Nodes: Stable to slightly increased enlarged right hilar lymph nodes, the largest with a short axis diameter of 20 mm on image number 123/5, previously 19 mm. Mild increase in size of an enlarged left hilar lymph node with a short axis diameter of 13 mm on image number 136/5, previously 10 mm. Mildly enlarged and borderline enlarged mediastinal nodes. The largest is a right paratracheal node with a short axis diameter of 11 mm on image number 57/5, previously 8 mm. Unremarkable thyroid gland, trachea and esophagus. Lungs/Pleura: Very small right pleural effusion and minimal left pleural effusion. Mild bilateral dependent atelectasis. Upper Abdomen: Progressive diffuse low density of the liver. 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 Review of the MIP images confirms the above findings. IMPRESSION: 1. No pulmonary emboli. 2. Very small right pleural effusion and minimal left pleural effusion. 3. Mild bilateral dependent atelectasis. 4. Mild  cardiomegaly. 5. Progressive diffuse hepatic steatosis. 6.  Calcific coronary artery and aortic atherosclerosis. 7. Mild mediastinal and bilateral hilar adenopathy with minimal progression, nonspecific. Aortic Atherosclerosis (ICD10-I70.0). Electronically Signed   By: Beckie Salts M.D.   On: 03/11/2023 18:50   DG Chest 2 View Result Date: 03/11/2023 CLINICAL DATA:  Chest pain EXAM: CHEST - 2 VIEW COMPARISON:  X-ray 03/04/2022 and older.  CT 10/13/2022 and older FINDINGS: No consolidation, pneumothorax or effusion. No edema. Normal cardiopericardial silhouette. Degenerative changes along the spine. Films are under penetrated. IMPRESSION: Under penetrated radiographs.  No acute cardiopulmonary disease Electronically Signed   By: Karen Kays M.D.   On: 03/11/2023 13:12    EKG:  Sinus rhythm borderline first-degree AV block, no acute ischemic changes.  Possible inferior Q, PRWP  ED Course:  Treated with aspirin 324 mg x 1, morphine, Zofran   Assessment/Plan:  67 y.o. male with hx hypertension, hyperlipidemia, diabetes,  alcohol abuse, who presents with sudden onset of epigastric pain /lower chest pain.   Epigastric pain/lower chest pain 2 days of constant pain in this region.  Vitals overall unremarkable.  Lab evaluation thus far unrevealing.  EKG with questionable old ischemic changes.  Troponin 6 x 2.  CTA negative for PE.  Incidental note of cardiomegaly, coronary artery disease, aortic atherosclerosis. although he has cardiac risk factors and imaging findings of CAD pain is atypical for angina.  Other strong considerations would include alcoholic gastritis, possible pancreatitis, possible biliary pathology.  -Initial plan for cardiology consultation this morning, would like to see results of additional testing per below before pursuing this. -Status post aspirin 324 mg x 1 load, continue 81 mg daily -Check lipid panel and A1c.  Tentatively increase his rosuvastatin to 20 mg daily -Check  lipase, LFTs, consider right upper quadrant ultrasound or CT abdomen pelvis for additional evaluation -Symptomatic management start pantoprazole 40 mg p.o. daily, GI cocktail prn.  Can trial GI medications first if they are helpful would continue this.  If not can consider trialing nitrates.   Alcohol abuse, at risk for alcohol withdrawal Drinks 8 beers per day, no history of prior withdrawals - CIWA with Ativan prn - Multivitamin, thiamine, folate - Counseled him on reduction in his alcohol use especially given hepatic steatosis and risk factor for developing cirrhosis.  Currently he is contemplative he has been drinking since 67 years old and this will be a big change  Lower extremity edema Systolic ejection murmur - Check echo  Mild hyponatremia Possibly related to alcohol use /hypovolemic with low solute intake.  Corrected sodium is 131 for hyperglycemia.  -Trend BMP, if worsening can workup further with osm, una   Chronic medical problems: Hypertension: BP improved and now low normal.  Hold his home amlodipine and losartan for now.  Continue his atenolol Hyperlipidemia: See above Diabetes with hyperglycemia: Check A1c, SSI while inpatient Gout: Continue home allopurinol Urethral stricture /BPH: Listed for multiple alpha blockers.  Continue tamsulosin for now Mood disorder: Continues home duloxetine  Body mass index is 44.17 kg/m.  Obesity class III affecting medical care per above  DVT prophylaxis:  Lovenox Code Status:  Full Code Diet:  Diet Orders (From admission, onward)     Start     Ordered   03/12/23 0031  Diet Heart Room service appropriate? Yes; Fluid consistency: Thin  Diet effective now       Question Answer Comment  Room service appropriate? Yes   Fluid consistency: Thin      03/12/23 0032           Family Communication: Yes discussed with his wife at the bedside Consults: Cardiology Admission status:   Observation, Telemetry bed  Severity of  Illness: The appropriate patient status for this patient is OBSERVATION. Observation status is judged to be reasonable and necessary in order to provide the required intensity of service to ensure the patient's safety. The patient's presenting symptoms, physical exam findings, and initial radiographic and laboratory data in the context of their medical condition is felt to place them at decreased risk for further clinical deterioration. Furthermore, it is anticipated that the patient will be medically stable for discharge from the hospital within 2 midnights of admission.    Dolly Rias, MD [facility]  How to contact the [facility] Attending or Consulting provider 7A - 7P or covering provider during after hours 7P -7A, for this patient.  Check the care team in [facility] and look  for a) attending/consulting TR[facility] provider listed and b) the [facility] team listed Log into [email or web address] and use [place]'s universal password to access. If you do not have the password, please contact the hospital operator. Locate the [facility] provider you are looking for under [facility] and page to a number that you can be directly reached. If you still have difficulty reaching the provider, please page the [facility]  (Director on Call) for the Hospitalists listed on amion for assistance.  03/12/2023, 5:17 AM

## 2023-03-12 NOTE — Inpatient Diabetes Management (Signed)
Inpatient Diabetes Program Recommendations  AACE/ADA: New Consensus Statement on Inpatient Glycemic Control (2015)  Target Ranges:  Prepandial:   less than 140 mg/dL      Peak postprandial:   less than 180 mg/dL (1-2 hours)      Critically ill patients:  140 - 180 mg/dL   Lab Results  Component Value Date   GLUCAP 268 (H) 03/12/2023   HGBA1C 7.5 (H) 03/12/2023    Review of Glycemic Control  Latest Reference Range & Units 03/12/23 08:03 03/12/23 11:14  Glucose-Capillary 70 - 99 mg/dL 161 (H) 096 (H)   Diabetes history: DM 2 Outpatient Diabetes medications:  Mounjaro 5 mg weekly Metformin 500 mg bid Current orders for Inpatient glycemic control:  Novolog 0-6 units tid with meals  Inpatient Diabetes Program Recommendations:    While in the hospital, consider increasing Novolog correction to moderate (0-15 units) tid with meals.  Also consider adding Semglee 15 units daily.   Thanks,  Beryl Meager, RN, BC-ADM Inpatient Diabetes Coordinator Pager [phone]  (8a-5p)

## 2023-03-12 NOTE — Progress Notes (Addendum)
   03/12/23 1018  TOC Brief Assessment  Insurance and Status Reviewed  Patient has primary care physician Yes  Home environment has been reviewed Home with spouse  Prior level of function: independent  Prior/Current Home Services No current home services  Social Drivers of Health Review SDOH reviewed no interventions necessary  Readmission risk has been reviewed Yes  Transition of care needs no transition of care needs at this time    Patient admitted to 8 beers a day, no withdrawal. Substance abuse resources added to AVS for patient to review.   Transition of Care Department [facility]) has reviewed patient and no TOC needs have been identified at this time. We will continue to monitor patient advancement through interdisciplinary progression rounds. If new patient transition needs arise, please place a TOC consult.

## 2023-03-12 NOTE — Plan of Care (Signed)

## 2023-03-12 NOTE — Progress Notes (Signed)
Mobility Specialist Progress Note:    03/12/23 1054  Mobility  Activity Ambulated with assistance in room;Stood at bedside  Level of Assistance Standby assist, set-up cues, supervision of patient - no hands on  Assistive Device None  Distance Ambulated (ft) 45 ft  Range of Motion/Exercises Active;All extremities  Activity Response Tolerated well  Mobility Referral Yes  Mobility visit 1 Mobility  Mobility Specialist Start Time (ACUTE ONLY) 1035  Mobility Specialist Stop Time (ACUTE ONLY) 1045  Mobility Specialist Time Calculation (min) (ACUTE ONLY) 10 min   Pt received in bed, family in room. Agreeable to mobility, required SBA to stand and ambulate with no AD. Tolerated well, SpO2 90% on 2L. Denies CP. Returned pt supine, all needs met.   Lawerance Bach Mobility Specialist Please contact via Special educational needs teacher or  Rehab office at [phone]

## 2023-03-12 NOTE — ED Notes (Signed)
ED TO INPATIENT HANDOFF REPORT  ED Nurse Name and Phone #: Ephriam Knuckles Medic   S Name/Age/Gender Manuel [name] 67 y.o. male Room/Bed: APA18/APA18  Code Status   Code Status: Full Code  Home/SNF/Other Home Patient oriented to: self, place, time, and situation Is this baseline? Yes   Triage Complete: Triage complete  Chief Complaint Chest pain [R07.9]  Triage Note Pt c/o mid sternal chest pain that started yesterday after lunch and has gotten progressively worse  Pt also c/o sob and a non-productive cough   Allergies No Known Allergies  Level of Care/Admitting Diagnosis ED Disposition     ED Disposition  Admit   Condition  --   Comment  Hospital Area: [facility] [[id number]]  Level of Care: Telemetry [5]  Covid Evaluation: Asymptomatic - no recent exposure (last 10 days) testing not required  Diagnosis: Chest pain [578469]  Admitting Physician: Dolly Rias [[id number]]  Attending Physician: Dolly Rias [[id number]]          B Medical/Surgery History Past Medical History:  Diagnosis Date   CKD (chronic kidney disease) 01/08/2017   patient states he does not have.   Diabetes mellitus without complication (HCC)    Essential hypertension, benign 01/08/2017   Gout    High cholesterol 01/08/2017   Hypertension    Obesity    Past Surgical History:  Procedure Laterality Date   CIRCUMCISION     COLONOSCOPY N/A 01/14/2017   Procedure: COLONOSCOPY;  Surgeon: Malissa Hippo, MD;  Location: [facility];  Service: Endoscopy;  Laterality: N/A;   CYSTOSCOPY WITH URETHRAL DILATATION N/A 03/28/2022   Procedure: CYSTOSCOPY WITH URETHRAL DILATATION;  Surgeon: Malen Gauze, MD;  Location: [facility];  Service: Urology;  Laterality: N/A;   DENTAL SURGERY     teeth removed     A IV Location/Drains/Wounds Patient Lines/Drains/Airways Status     Active Line/Drains/Airways     Name Placement date Placement time Site Days   Peripheral IV 03/11/23 20 G  1" Right Antecubital 03/11/23  1608  Antecubital  1            Intake/Output Last 24 hours No intake or output data in the 24 hours ending 03/12/23 0107  Labs/Imaging Results for orders placed or performed during the hospital encounter of 03/11/23 (from the past 48 hours)  Basic metabolic panel     Status: Abnormal   Collection Time: 03/11/23 10:57 AM  Result Value Ref Range   Sodium 128 (L) 135 - 145 mmol/L   Potassium 4.7 3.5 - 5.1 mmol/L   Chloride 96 (L) 98 - 111 mmol/L   CO2 24 22 - 32 mmol/L   Glucose, Bld 278 (H) 70 - 99 mg/dL    Comment: Glucose reference range applies only to samples taken after fasting for at least 8 hours.   BUN 11 8 - 23 mg/dL   Creatinine, Ser 0.27 0.61 - 1.24 mg/dL   Calcium 9.0 8.9 - 25.3 mg/dL   GFR, Estimated >66 >44 mL/min    Comment: (NOTE) Calculated using the CKD-EPI Creatinine Equation (2021)    Anion gap 8 5 - 15    Comment: Performed at [facility], [address], [place], [place] [address]  CBC     Status: Abnormal   Collection Time: 03/11/23 10:57 AM  Result Value Ref Range   WBC 11.8 (H) 4.0 - 10.5 K/uL   RBC 5.03 4.22 - 5.81 MIL/uL   Hemoglobin 15.3 13.0 - 17.0 g/dL   HCT 44.9  39.0 - 52.0 %   MCV 89.3 80.0 - 100.0 fL   MCH 30.4 26.0 - 34.0 pg   MCHC 34.1 30.0 - 36.0 g/dL   RDW 09.8 11.9 - 14.7 %   Platelets 146 (L) 150 - 400 K/uL   nRBC 0.0 0.0 - 0.2 %    Comment: Performed at [facility], [address] [place], [place] [address]  Troponin I (High Sensitivity)     Status: None   Collection Time: 03/11/23 10:57 AM  Result Value Ref Range   Troponin I (High Sensitivity) 6 <18 ng/L    Comment: (NOTE) Elevated high sensitivity troponin I (hsTnI) values and significant  changes across serial measurements may suggest ACS but many other  chronic and acute conditions are known to elevate hsTnI results.  Refer to the "Links" section for chest pain algorithms and additional  guidance. Performed at [facility], [address], [place], [place] [address]   Brain natriuretic peptide     Status: None   Collection Time: 03/11/23 10:57 AM  Result Value Ref Range   B Natriuretic Peptide 34.0 0.0 - 100.0 pg/mL    Comment: Performed at [facility], [address], [place], [place] [address]  Troponin I (High Sensitivity)     Status: None   Collection Time: 03/11/23 12:33 PM  Result Value Ref Range   Troponin I (High Sensitivity) 6 <18 ng/L    Comment: (NOTE) Elevated high sensitivity troponin I (hsTnI) values and significant  changes across serial measurements may suggest ACS but many other  chronic and acute conditions are known to elevate hsTnI results.  Refer to the "Links" section for chest pain algorithms and additional  guidance. Performed at [facility], [address] [place], [place] [address]    CT Angio Chest PE W and/or Wo Contrast Result Date: 03/11/2023 CLINICAL DATA:  Midsternal chest pain since yesterday afternoon with shortness of breath and nonproductive cough. EXAM: CT ANGIOGRAPHY CHEST WITH CONTRAST TECHNIQUE: Multidetector CT imaging of the chest was performed using the standard protocol during bolus administration of intravenous contrast. Multiplanar CT image reconstructions and MIPs were obtained to evaluate the vascular anatomy. RADIATION DOSE REDUCTION: This exam was performed according to the departmental dose-optimization program which includes automated exposure control, adjustment of the mA and/or kV according to patient size and/or use of iterative reconstruction technique. CONTRAST:  75mL OMNIPAQUE IOHEXOL 350 MG/ML SOLN COMPARISON:  Chest radiographs obtained earlier today. Chest CT dated 10/13/2022. FINDINGS: Cardiovascular: Atheromatous calcifications, including the coronary arteries and aorta. Normally opacified pulmonary arteries with no pulmonary arterial filling defects seen. Mildly enlarged heart. Prominent epicardial fat. No pericardial effusion. Mediastinum/Nodes: Stable to  slightly increased enlarged right hilar lymph nodes, the largest with a short axis diameter of 20 mm on image number 123/5, previously 19 mm. Mild increase in size of an enlarged left hilar lymph node with a short axis diameter of 13 mm on image number 136/5, previously 10 mm. Mildly enlarged and borderline enlarged mediastinal nodes. The largest is a right paratracheal node with a short axis diameter of 11 mm on image number 57/5, previously 8 mm. Unremarkable thyroid gland, trachea and esophagus. Lungs/Pleura: Very small right pleural effusion and minimal left pleural effusion. Mild bilateral dependent atelectasis. Upper Abdomen: Progressive diffuse low density of the liver. 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 Review of the MIP images confirms the above findings. IMPRESSION: 1. No pulmonary emboli. 2. Very small right pleural effusion and  minimal left pleural effusion. 3. Mild bilateral dependent atelectasis. 4. Mild cardiomegaly. 5. Progressive diffuse hepatic steatosis. 6.  Calcific coronary artery and aortic atherosclerosis. 7. Mild mediastinal and bilateral hilar adenopathy with minimal progression, nonspecific. Aortic Atherosclerosis (ICD10-I70.0). Electronically Signed   By: Beckie Salts M.D.   On: 03/11/2023 18:50   DG Chest 2 View Result Date: 03/11/2023 CLINICAL DATA:  Chest pain EXAM: CHEST - 2 VIEW COMPARISON:  X-ray 03/04/2022 and older.  CT 10/13/2022 and older FINDINGS: No consolidation, pneumothorax or effusion. No edema. Normal cardiopericardial silhouette. Degenerative changes along the spine. Films are under penetrated. IMPRESSION: Under penetrated radiographs.  No acute cardiopulmonary disease Electronically Signed   By: Karen Kays M.D.   On: 03/11/2023 13:12    Pending Labs Unresulted Labs (From admission, onward)     Start     Ordered   03/12/23 0500  HIV Antibody (routine testing w rflx)  (HIV Antibody (Routine testing w  reflex) panel)  Tomorrow morning,   R        03/12/23 0032   03/12/23 0500  Basic metabolic panel  Tomorrow morning,   R        03/12/23 0032   03/12/23 0500  CBC  Tomorrow morning,   R        03/12/23 0032   03/12/23 0500  Magnesium  Tomorrow morning,   R        03/12/23 0032   03/12/23 0500  Phosphorus  Tomorrow morning,   R        03/12/23 0032   03/12/23 0500  Lipid panel  Tomorrow morning,   R        03/12/23 0032   03/12/23 0500  Hemoglobin A1c  Tomorrow morning,   R        03/12/23 0032            Vitals/Pain Today's Vitals   03/11/23 1947 03/11/23 2115 03/11/23 2330 03/12/23 0045  BP: 124/80 129/78 99/70 128/76  [name]: 82 79 81 74  Resp: 18 18  16   Temp:      TempSrc:      SpO2: 93% 97% 91% 95%  Weight:      Height:      PainSc:        Isolation Precautions No active isolations  Medications Medications  enoxaparin (LOVENOX) injection 75 mg (has no administration in time range)  sodium chloride flush (NS) 0.9 % injection 3 mL (3 mLs Intravenous Given 03/12/23 0102)  alum & mag hydroxide-simeth (MAALOX/MYLANTA) 200-200-20 MG/5ML suspension 30 mL (30 mLs Oral Patient Refused/Not Given 03/12/23 0104)  aspirin chewable tablet 81 mg (has no administration in time range)  atorvastatin (LIPITOR) tablet 40 mg (has no administration in time range)  acetaminophen (TYLENOL) tablet 1,000 mg (has no administration in time range)  polyethylene glycol (MIRALAX / GLYCOLAX) packet 17 g (has no administration in time range)  ondansetron (ZOFRAN) tablet 4 mg (has no administration in time range)    Or  ondansetron (ZOFRAN) injection 4 mg (has no administration in time range)  insulin aspart (novoLOG) injection 0-6 Units (has no administration in time range)  aspirin chewable tablet 324 mg (324 mg Oral Given 03/11/23 1400)  morphine (PF) 4 MG/ML injection 4 mg (4 mg Intravenous Given 03/11/23 1716)  ondansetron (ZOFRAN) injection 4 mg (4 mg Intravenous Given 03/11/23 1716)   iohexol (OMNIPAQUE) 350 MG/ML injection 75 mL (75 mLs Intravenous Contrast Given 03/11/23 1654)  morphine (PF) 4 MG/ML injection 4  mg (4 mg Intravenous Given 03/11/23 2233)    Mobility walks     Focused Assessments Cardiac Assessment Handoff:    No results found for: "CKTOTAL", "CKMB", "CKMBINDEX", "TROPONINI" No results found for: "DDIMER" Does the Patient currently have chest pain? No    R Recommendations: See Admitting Provider Note  Report given to:   Additional Notes:

## 2023-03-12 NOTE — Plan of Care (Signed)
Problem: Education: Goal: Ability to describe self-care measures that may prevent or decrease complications (Diabetes Survival Skills Education) will improve 03/12/2023 0552 by Joylene John, RN Outcome: Progressing 03/12/2023 0127 by Joylene John, RN Outcome: Progressing Goal: Individualized Educational Video(s) 03/12/2023 0552 by Joylene John, RN Outcome: Progressing 03/12/2023 0127 by Joylene John, RN Outcome: Progressing   Problem: Coping: Goal: Ability to adjust to condition or change in health will improve 03/12/2023 0552 by Joylene John, RN Outcome: Progressing 03/12/2023 0127 by Joylene John, RN Outcome: Progressing   Problem: Fluid Volume: Goal: Ability to maintain a balanced intake and output will improve 03/12/2023 0552 by Joylene John, RN Outcome: Progressing 03/12/2023 0127 by Joylene John, RN Outcome: Progressing   Problem: Health Behavior/Discharge Planning: Goal: Ability to identify and utilize available resources and services will improve 03/12/2023 0552 by Joylene John, RN Outcome: Progressing 03/12/2023 0127 by Joylene John, RN Outcome: Progressing Goal: Ability to manage health-related needs will improve 03/12/2023 0552 by Joylene John, RN Outcome: Progressing 03/12/2023 0127 by Joylene John, RN Outcome: Progressing   Problem: Metabolic: Goal: Ability to maintain appropriate glucose levels will improve 03/12/2023 0552 by Joylene John, RN Outcome: Progressing 03/12/2023 0127 by Joylene John, RN Outcome: Progressing   Problem: Nutritional: Goal: Maintenance of adequate nutrition will improve 03/12/2023 0552 by Joylene John, RN Outcome: Progressing 03/12/2023 0127 by Joylene John, RN Outcome: Progressing Goal: Progress toward achieving an optimal weight will improve 03/12/2023 0552 by Joylene John, RN Outcome: Progressing 03/12/2023 0127 by  Joylene John, RN Outcome: Progressing   Problem: Skin Integrity: Goal: Risk for impaired skin integrity will decrease 03/12/2023 0552 by Joylene John, RN Outcome: Progressing 03/12/2023 0127 by Joylene John, RN Outcome: Progressing   Problem: Tissue Perfusion: Goal: Adequacy of tissue perfusion will improve 03/12/2023 0552 by Joylene John, RN Outcome: Progressing 03/12/2023 0127 by Joylene John, RN Outcome: Progressing   Problem: Education: Goal: Knowledge of General Education information will improve Description: Including pain rating scale, medication(s)/side effects and non-pharmacologic comfort measures 03/12/2023 0552 by Joylene John, RN Outcome: Progressing 03/12/2023 0127 by Joylene John, RN Outcome: Progressing   Problem: Health Behavior/Discharge Planning: Goal: Ability to manage health-related needs will improve 03/12/2023 0552 by Joylene John, RN Outcome: Progressing 03/12/2023 0127 by Joylene John, RN Outcome: Progressing   Problem: Clinical Measurements: Goal: Ability to maintain clinical measurements within normal limits will improve 03/12/2023 0552 by Joylene John, RN Outcome: Progressing 03/12/2023 0127 by Joylene John, RN Outcome: Progressing Goal: Will remain free from infection 03/12/2023 0552 by Joylene John, RN Outcome: Progressing 03/12/2023 0127 by Joylene John, RN Outcome: Progressing Goal: Diagnostic test results will improve 03/12/2023 0552 by Joylene John, RN Outcome: Progressing 03/12/2023 0127 by Joylene John, RN Outcome: Progressing Goal: Respiratory complications will improve 03/12/2023 0552 by Joylene John, RN Outcome: Progressing 03/12/2023 0127 by Joylene John, RN Outcome: Progressing Goal: Cardiovascular complication will be avoided 03/12/2023 0552 by Joylene John, RN Outcome: Progressing 03/12/2023 0127 by Joylene John, RN Outcome: Progressing   Problem: Activity: Goal: Risk for activity intolerance will decrease 03/12/2023 0552 by Joylene John, RN Outcome: Progressing 03/12/2023 0127 by Joylene John, RN Outcome: Progressing   Problem: Nutrition: Goal: Adequate nutrition will be maintained 03/12/2023 0552 by Joylene John, RN Outcome: Progressing 03/12/2023 0127 by Joylene John, RN Outcome: Progressing  Problem: Coping: Goal: Level of anxiety will decrease 03/12/2023 0552 by Joylene John, RN Outcome: Progressing 03/12/2023 0127 by Joylene John, RN Outcome: Progressing   Problem: Elimination: Goal: Will not experience complications related to bowel motility 03/12/2023 0552 by Joylene John, RN Outcome: Progressing 03/12/2023 0127 by Joylene John, RN Outcome: Progressing Goal: Will not experience complications related to urinary retention 03/12/2023 0552 by Joylene John, RN Outcome: Progressing 03/12/2023 0127 by Joylene John, RN Outcome: Progressing   Problem: Pain Management: Goal: General experience of comfort will improve 03/12/2023 0552 by Joylene John, RN Outcome: Progressing 03/12/2023 0127 by Joylene John, RN Outcome: Progressing   Problem: Safety: Goal: Ability to remain free from injury will improve 03/12/2023 0552 by Joylene John, RN Outcome: Progressing 03/12/2023 0127 by Joylene John, RN Outcome: Progressing   Problem: Skin Integrity: Goal: Risk for impaired skin integrity will decrease 03/12/2023 0552 by Joylene John, RN Outcome: Progressing 03/12/2023 0127 by Joylene John, RN Outcome: Progressing

## 2023-03-12 NOTE — Progress Notes (Signed)
   03/12/23 1419  ReDS Vest / Clip  Station Marker D  Ruler Value 44  ReDS Value Range < 36  ReDS Actual Value 32

## 2023-03-12 NOTE — Progress Notes (Signed)
Central telemetry called and stated that patient was in SVT w/ HR in the 140-150's since 1702. This RN came to pt room to assess patient. Patient was up walking back from the bathroom. He denies chest pain and dizziness but he does endorse shortness of breath. Yellow mews protocol initiated. Notified Andria Rhein charge RN and Dr. Gwenlyn Perking. See new orders.     03/12/23 1712  Assess: MEWS Score  Temp 98.1 F (36.7 C)  BP (!) 157/62  MAP (mmHg) 88  Pulse Rate (!) 106  SpO2 98 %  O2 Device Nasal Cannula  O2 Flow Rate (L/min) 2 L/min  Assess: MEWS Score  MEWS Temp 0  MEWS Systolic 0  MEWS Pulse 1  MEWS RR 1  MEWS LOC 0  MEWS Score 2  MEWS Score Color Yellow  Assess: if the MEWS score is Yellow or Red  Were vital signs accurate and taken at a resting state? Yes  Does the patient meet 2 or more of the SIRS criteria? Yes  Does the patient have a confirmed or suspected source of infection? No  MEWS guidelines implemented  Yes, yellow  Treat  MEWS Interventions Considered administering scheduled or prn medications/treatments as ordered  Take Vital Signs  Increase Vital Sign Frequency  Yellow: Q2hr x1, continue Q4hrs until patient remains green for 12hrs  Escalate  MEWS: Escalate Yellow: Discuss with charge nurse and consider notifying provider and/or RRT  Notify: Charge Nurse/RN  Name of Charge Nurse/RN Notified Andria Rhein, RN  Provider Notification  Provider Name/Title Dr. Gwenlyn Perking  Date Provider Notified 03/12/23  Time Provider Notified 1715  Method of Notification Page  Notification Reason Other (Comment) (SVT / yellow mews)  Provider response See new orders  Date of Provider Response 03/12/23  Time of Provider Response 1715  Assess: SIRS CRITERIA  SIRS Temperature  0  SIRS Respirations  1  SIRS Pulse 1  SIRS WBC 0  SIRS Score Sum  2

## 2023-03-12 NOTE — Progress Notes (Signed)
*  PRELIMINARY RESULTS* Echocardiogram 2D Echocardiogram has been performed.  Stacey Drain 03/12/2023, 2:48 PM

## 2023-03-13 DIAGNOSIS — R0600 Dyspnea, unspecified: Secondary | ICD-10-CM | POA: Diagnosis not present

## 2023-03-13 DIAGNOSIS — E7800 Pure hypercholesterolemia, unspecified: Secondary | ICD-10-CM | POA: Diagnosis present

## 2023-03-13 DIAGNOSIS — K760 Fatty (change of) liver, not elsewhere classified: Secondary | ICD-10-CM | POA: Diagnosis present

## 2023-03-13 DIAGNOSIS — I471 Supraventricular tachycardia, unspecified: Secondary | ICD-10-CM | POA: Diagnosis present

## 2023-03-13 DIAGNOSIS — M1712 Unilateral primary osteoarthritis, left knee: Secondary | ICD-10-CM | POA: Diagnosis present

## 2023-03-13 DIAGNOSIS — N35919 Unspecified urethral stricture, male, unspecified site: Secondary | ICD-10-CM | POA: Diagnosis present

## 2023-03-13 DIAGNOSIS — M109 Gout, unspecified: Secondary | ICD-10-CM | POA: Diagnosis present

## 2023-03-13 DIAGNOSIS — Z79899 Other long term (current) drug therapy: Secondary | ICD-10-CM | POA: Diagnosis not present

## 2023-03-13 DIAGNOSIS — R0602 Shortness of breath: Secondary | ICD-10-CM | POA: Diagnosis not present

## 2023-03-13 DIAGNOSIS — I10 Essential (primary) hypertension: Secondary | ICD-10-CM | POA: Diagnosis present

## 2023-03-13 DIAGNOSIS — E118 Type 2 diabetes mellitus with unspecified complications: Secondary | ICD-10-CM | POA: Diagnosis not present

## 2023-03-13 DIAGNOSIS — Z7985 Long-term (current) use of injectable non-insulin antidiabetic drugs: Secondary | ICD-10-CM | POA: Diagnosis not present

## 2023-03-13 DIAGNOSIS — Z7982 Long term (current) use of aspirin: Secondary | ICD-10-CM | POA: Diagnosis not present

## 2023-03-13 DIAGNOSIS — F1010 Alcohol abuse, uncomplicated: Secondary | ICD-10-CM | POA: Diagnosis present

## 2023-03-13 DIAGNOSIS — Z8249 Family history of ischemic heart disease and other diseases of the circulatory system: Secondary | ICD-10-CM | POA: Diagnosis not present

## 2023-03-13 DIAGNOSIS — E8770 Fluid overload, unspecified: Secondary | ICD-10-CM | POA: Diagnosis present

## 2023-03-13 DIAGNOSIS — R079 Chest pain, unspecified: Secondary | ICD-10-CM | POA: Diagnosis present

## 2023-03-13 DIAGNOSIS — Z7984 Long term (current) use of oral hypoglycemic drugs: Secondary | ICD-10-CM | POA: Diagnosis not present

## 2023-03-13 DIAGNOSIS — E1165 Type 2 diabetes mellitus with hyperglycemia: Secondary | ICD-10-CM | POA: Diagnosis present

## 2023-03-13 DIAGNOSIS — J9601 Acute respiratory failure with hypoxia: Secondary | ICD-10-CM | POA: Diagnosis not present

## 2023-03-13 DIAGNOSIS — R011 Cardiac murmur, unspecified: Secondary | ICD-10-CM | POA: Diagnosis not present

## 2023-03-13 DIAGNOSIS — E66813 Obesity, class 3: Secondary | ICD-10-CM | POA: Diagnosis present

## 2023-03-13 DIAGNOSIS — Z6841 Body Mass Index (BMI) 40.0 and over, adult: Secondary | ICD-10-CM | POA: Diagnosis not present

## 2023-03-13 DIAGNOSIS — F32A Depression, unspecified: Secondary | ICD-10-CM | POA: Diagnosis present

## 2023-03-13 DIAGNOSIS — N400 Enlarged prostate without lower urinary tract symptoms: Secondary | ICD-10-CM | POA: Diagnosis present

## 2023-03-13 DIAGNOSIS — K219 Gastro-esophageal reflux disease without esophagitis: Secondary | ICD-10-CM | POA: Diagnosis not present

## 2023-03-13 DIAGNOSIS — R1013 Epigastric pain: Secondary | ICD-10-CM | POA: Diagnosis present

## 2023-03-13 DIAGNOSIS — E871 Hypo-osmolality and hyponatremia: Secondary | ICD-10-CM | POA: Diagnosis present

## 2023-03-13 DIAGNOSIS — R0789 Other chest pain: Secondary | ICD-10-CM | POA: Diagnosis not present

## 2023-03-13 DIAGNOSIS — I700 Atherosclerosis of aorta: Secondary | ICD-10-CM | POA: Diagnosis present

## 2023-03-13 DIAGNOSIS — J40 Bronchitis, not specified as acute or chronic: Secondary | ICD-10-CM | POA: Diagnosis present

## 2023-03-13 LAB — GLUCOSE, CAPILLARY
Glucose-Capillary: 214 mg/dL — ABNORMAL HIGH (ref 70–99)
Glucose-Capillary: 216 mg/dL — ABNORMAL HIGH (ref 70–99)
Glucose-Capillary: 229 mg/dL — ABNORMAL HIGH (ref 70–99)
Glucose-Capillary: 243 mg/dL — ABNORMAL HIGH (ref 70–99)

## 2023-03-13 LAB — BASIC METABOLIC PANEL
Anion gap: 11 (ref 5–15)
BUN: 17 mg/dL (ref 8–23)
CO2: 22 mmol/L (ref 22–32)
Calcium: 8.7 mg/dL — ABNORMAL LOW (ref 8.9–10.3)
Chloride: 98 mmol/L (ref 98–111)
Creatinine, Ser: 1.14 mg/dL (ref 0.61–1.24)
GFR, Estimated: >60 mL/min (ref >60)
Glucose, Bld: 234 mg/dL — ABNORMAL HIGH (ref 70–99)
Potassium: 4.1 mmol/L (ref 3.5–5.1)
Sodium: 131 mmol/L — ABNORMAL LOW (ref 135–145)

## 2023-03-13 LAB — CBC
HCT: 39.6 % (ref 39.0–52.0)
Hemoglobin: 13.3 g/dL (ref 13.0–17.0)
MCH: 30.6 pg (ref 26.0–34.0)
MCHC: 33.6 g/dL (ref 30.0–36.0)
MCV: 91 fL (ref 80.0–100.0)
Platelets: 143 10*3/uL — ABNORMAL LOW (ref 150–400)
RBC: 4.35 MIL/uL (ref 4.22–5.81)
RDW: 12.7 % (ref 11.5–15.5)
WBC: 13.4 10*3/uL — ABNORMAL HIGH (ref 4.0–10.5)
nRBC: 0 % (ref 0.0–0.2)

## 2023-03-13 LAB — MAGNESIUM: Magnesium: 2.1 mg/dL (ref 1.7–2.4)

## 2023-03-13 MED ORDER — AMOXICILLIN-POT CLAVULANATE 875-125 MG PO TABS
1.0000 | ORAL_TABLET | Freq: Two times a day (BID) | ORAL | Status: DC
  Administered 2023-03-13 – 2023-03-14 (×3): 1 via ORAL
  Filled 2023-03-13 (×3): qty 1

## 2023-03-13 MED ORDER — FUROSEMIDE 10 MG/ML IJ SOLN
60.0000 mg | Freq: Four times a day (QID) | INTRAMUSCULAR | Status: AC
  Administered 2023-03-13 (×2): 60 mg via INTRAVENOUS
  Filled 2023-03-13 (×2): qty 6

## 2023-03-13 MED ORDER — DM-GUAIFENESIN ER 30-600 MG PO TB12
1.0000 | ORAL_TABLET | Freq: Two times a day (BID) | ORAL | Status: DC
Start: 2023-03-13 — End: 2023-03-14
  Administered 2023-03-13 – 2023-03-14 (×3): 1 via ORAL
  Filled 2023-03-13 (×3): qty 1

## 2023-03-13 NOTE — Care Management Obs Status (Signed)
MEDICARE OBSERVATION STATUS NOTIFICATION   Patient Details  Name: Manuel [name] MRN: [id number] Date of Birth: 11-14-1955   Medicare Observation Status Notification Given:  Yes    Corey Harold 03/13/2023, 11:02 AM

## 2023-03-13 NOTE — Progress Notes (Signed)
Rounding Note    Patient Name: Manuel [name] Date of Encounter: 03/13/2023  [facility] Cardiologist: New  Subjective   Some ongoing SOB. Episode of SVT yesterday.   Inpatient Medications    Scheduled Meds:  allopurinol  300 mg Oral Daily   alum & mag hydroxide-simeth  30 mL Oral Once   aspirin EC  81 mg Oral Daily   docusate sodium  100 mg Oral BID   DULoxetine  30 mg Oral Daily   enoxaparin (LOVENOX) injection  75 mg Subcutaneous Q24H   folic acid  1 mg Oral Daily   insulin aspart  0-6 Units Subcutaneous TID WC   insulin glargine-yfgn  12 Units Subcutaneous QHS   metoprolol tartrate  12.5 mg Oral BID   multivitamin with minerals  1 tablet Oral Daily   pantoprazole  40 mg Oral BID   polyethylene glycol  17 g Oral Daily   pregabalin  300 mg Oral BID   rosuvastatin  5 mg Oral QPM   sodium chloride flush  3 mL Intravenous Q12H   tamsulosin  0.4 mg Oral QPC supper   thiamine  100 mg Oral Daily   Or   thiamine  100 mg Intravenous Daily   Continuous Infusions:  PRN Meds: acetaminophen, LORazepam **OR** LORazepam, ondansetron **OR** ondansetron (ZOFRAN) IV   Vital Signs    Vitals:   03/12/23 1950 03/12/23 2142 03/12/23 2343 03/13/23 0407  BP: (!) 113/57  128/72 136/67  Pulse: (!) 102  97 93  Resp: 20  (!) 26 20  Temp:  100.3 F (37.9 C) 100.2 F (37.9 C) 99 F (37.2 C)  TempSrc:      SpO2: 93%  94% 94%  Weight:    (!) 154 kg  Height:        Intake/Output Summary (Last 24 hours) at 03/13/2023 0817 Last data filed at 03/13/2023 0300 Gross per 24 hour  Intake 960 ml  Output 800 ml  Net 160 ml      03/13/2023    4:07 AM 03/12/2023    5:00 AM 03/11/2023   10:40 AM  Last 3 Weights  Weight (lbs) 339 lb 8.1 oz 348 lb 1.7 oz 344 lb  Weight (kg) 154 kg 157.9 kg 156.037 kg      Telemetry    SR, isolated episode of SVT - Personally Reviewed  ECG    N/a - Personally Reviewed  Physical Exam   GEN: No acute distress.   Neck: No  JVD Cardiac: RRR, no murmurs, rubs, or gallops.  Respiratory: mild coarse bilateral breath sounds GI: Soft, nontender, non-distended  MS: 1-2+ bilateral LE edema Neuro:  Nonfocal  Psych: Normal affect   Labs    High Sensitivity Troponin:   Recent Labs  Lab 03/11/23 1057 03/11/23 1233  TROPONINIHS 6 6     Chemistry Recent Labs  Lab 03/11/23 1057 03/12/23 0407  NA 128* 129*  K 4.7 3.9  CL 96* 98  CO2 24 23  GLUCOSE 278* 201*  BUN 11 18  CREATININE 1.10 1.16  CALCIUM 9.0 8.6*  MG  --  2.0  PROT  --  6.8  ALBUMIN  --  3.1*  AST  --  22  ALT  --  29  ALKPHOS  --  48  BILITOT  --  2.2*  GFRNONAA >60 >60  ANIONGAP 8 8    Lipids  Recent Labs  Lab 03/12/23 0407  CHOL 96  TRIG 81  HDL 61  LDLCALC 19  CHOLHDL 1.6    Hematology Recent Labs  Lab 03/11/23 1057 03/12/23 0407  WBC 11.8* 13.4*  RBC 5.03 4.40  HGB 15.3 13.1  HCT 44.9 40.1  MCV 89.3 91.1  MCH 30.4 29.8  MCHC 34.1 32.7  RDW 12.7 12.7  PLT 146* 117*   Thyroid No results for input(s): "TSH", "FREET4" in the last 168 hours.  BNP Recent Labs  Lab 03/11/23 1057 03/12/23 0408  BNP 34.0 32.0    DDimer No results for input(s): "DDIMER" in the last 168 hours.   Radiology    ECHOCARDIOGRAM COMPLETE Result Date: 03/12/2023    ECHOCARDIOGRAM REPORT   Patient Name:   Manuel [name] Date of Exam: 03/12/2023 Medical Rec #:  [id number]      Height:       74.0 in Accession #:    [id number]     Weight:       348.1 lb Date of Birth:  09/29/1955       BSA:          2.751 m Patient Age:    67 years       BP:           94/45 mmHg Patient Gender: M              HR:           80 bpm. [facility] Procedure: 2D Echo, Cardiac Doppler, Color Doppler and Intracardiac            Opacification Agent Indications:    Congestive Heart Failure I50.9                 Murmur R01.1  History:        Patient has no prior history of Echocardiogram examinations.                 Risk Factors:Hypertension, Dyslipidemia and  CKD.  Sonographer:    Celesta Gentile RCS Referring Phys: [id number] Tova Vater SEGARS IMPRESSIONS  1. Left ventricular ejection fraction, by estimation, is 65 to 70%. The left ventricle has normal function. The left ventricle has no regional wall motion abnormalities. There is mild left ventricular hypertrophy. Left ventricular diastolic parameters are indeterminate. Elevated left atrial pressure.  2. Right ventricular systolic function is normal. The right ventricular size is normal. Tricuspid regurgitation signal is inadequate for assessing PA pressure.  3. Left atrial size was mildly dilated.  4. The mitral valve was not well visualized. No evidence of mitral valve regurgitation. No evidence of mitral stenosis.  5. The aortic valve was not well visualized. There is mild calcification of the aortic valve. There is mild thickening of the aortic valve. Aortic valve regurgitation is not visualized. Aortic valve sclerosis is present, with no evidence of aortic valve  stenosis. FINDINGS  Left Ventricle: Left ventricular ejection fraction, by estimation, is 65 to 70%. The left ventricle has normal function. The left ventricle has no regional wall motion abnormalities. Definity contrast agent was given IV to delineate the left ventricular  endocardial borders. The left ventricular internal cavity size was normal in size. There is mild left ventricular hypertrophy. Left ventricular diastolic parameters are indeterminate. Elevated left atrial pressure. Right Ventricle: The right ventricular size is normal. Right vetricular wall thickness was not well visualized. Right ventricular systolic function is normal. Tricuspid regurgitation signal is inadequate for assessing PA pressure. Left Atrium: Left atrial size was mildly dilated. Right Atrium: Right atrial size was normal in size.  Pericardium: There is no evidence of pericardial effusion. Mitral Valve: The mitral valve was not well visualized. No evidence of mitral valve  regurgitation. No evidence of mitral valve stenosis. Tricuspid Valve: The tricuspid valve is not well visualized. Tricuspid valve regurgitation is not demonstrated. No evidence of tricuspid stenosis. Aortic Valve: The aortic valve was not well visualized. There is mild calcification of the aortic valve. There is mild thickening of the aortic valve. There is mild aortic valve annular calcification. Aortic valve regurgitation is not visualized. Aortic valve sclerosis is present, with no evidence of aortic valve stenosis. Aortic valve mean gradient measures 5.6 mmHg. Aortic valve peak gradient measures 10.5 mmHg. Aortic valve area, by VTI measures 3.13 cm. Pulmonic Valve: The pulmonic valve was not well visualized. Pulmonic valve regurgitation is not visualized. No evidence of pulmonic stenosis. Aorta: The aortic root is normal in size and structure. Venous: The inferior vena cava was not well visualized. IAS/Shunts: The interatrial septum was not well visualized.  LEFT VENTRICLE PLAX 2D LVIDd:         4.40 cm   Diastology LVIDs:         3.00 cm   LV e' medial:    7.62 cm/s LV PW:         1.20 cm   LV E/e' medial:  15.5 LV IVS:        1.10 cm   LV e' lateral:   8.59 cm/s LVOT diam:     2.10 cm   LV E/e' lateral: 13.7 LV SV:         108 LV SV Index:   39 LVOT Area:     3.46 cm  RIGHT VENTRICLE RV S prime:     13.60 cm/s TAPSE (M-mode): 2.5 cm LEFT ATRIUM              Index        RIGHT ATRIUM           Index LA diam:        3.60 cm  1.31 cm/m   RA Area:     19.20 cm LA Vol (A2C):   95.0 ml  34.54 ml/m  RA Volume:   60.00 ml  21.81 ml/m LA Vol (A4C):   116.0 ml 42.17 ml/m LA Biplane Vol: 107.0 ml 38.90 ml/m  AORTIC VALVE AV Area (Vmax):    3.05 cm AV Area (Vmean):   3.30 cm AV Area (VTI):     3.13 cm AV Vmax:           162.14 cm/s AV Vmean:          111.337 cm/s AV VTI:            0.345 m AV Peak Grad:      10.5 mmHg AV Mean Grad:      5.6 mmHg LVOT Vmax:         143.00 cm/s LVOT Vmean:        106.000 cm/s  LVOT VTI:          0.312 m LVOT/AV VTI ratio: 0.90  AORTA Ao Root diam: 3.50 cm MITRAL VALVE MV Area (PHT): 4.31 cm     SHUNTS MV Decel Time: 176 msec     Systemic VTI:  0.31 m MV E velocity: 118.00 cm/s  Systemic Diam: 2.10 cm MV A velocity: 92.10 cm/s MV E/A ratio:  1.28 Dina Rich MD Electronically signed by Dina Rich MD Signature Date/Time: 03/12/2023/3:21:55 PM    Final  CT Angio Chest PE W and/or Wo Contrast Result Date: 03/11/2023 CLINICAL DATA:  Midsternal chest pain since yesterday afternoon with shortness of breath and nonproductive cough. EXAM: CT ANGIOGRAPHY CHEST WITH CONTRAST TECHNIQUE: Multidetector CT imaging of the chest was performed using the standard protocol during bolus administration of intravenous contrast. Multiplanar CT image reconstructions and MIPs were obtained to evaluate the vascular anatomy. RADIATION DOSE REDUCTION: This exam was performed according to the departmental dose-optimization program which includes automated exposure control, adjustment of the mA and/or kV according to patient size and/or use of iterative reconstruction technique. CONTRAST:  75mL OMNIPAQUE IOHEXOL 350 MG/ML SOLN COMPARISON:  Chest radiographs obtained earlier today. Chest CT dated 10/13/2022. FINDINGS: Cardiovascular: Atheromatous calcifications, including the coronary arteries and aorta. Normally opacified pulmonary arteries with no pulmonary arterial filling defects seen. Mildly enlarged heart. Prominent epicardial fat. No pericardial effusion. Mediastinum/Nodes: Stable to slightly increased enlarged right hilar lymph nodes, the largest with a short axis diameter of 20 mm on image number 123/5, previously 19 mm. Mild increase in size of an enlarged left hilar lymph node with a short axis diameter of 13 mm on image number 136/5, previously 10 mm. Mildly enlarged and borderline enlarged mediastinal nodes. The largest is a right paratracheal node with a short axis diameter of 11 mm on  image number 57/5, previously 8 mm. Unremarkable thyroid gland, trachea and esophagus. Lungs/Pleura: Very small right pleural effusion and minimal left pleural effusion. Mild bilateral dependent atelectasis. Upper Abdomen: Progressive diffuse low density of the liver. 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 Review of the MIP images confirms the above findings. IMPRESSION: 1. No pulmonary emboli. 2. Very small right pleural effusion and minimal left pleural effusion. 3. Mild bilateral dependent atelectasis. 4. Mild cardiomegaly. 5. Progressive diffuse hepatic steatosis. 6.  Calcific coronary artery and aortic atherosclerosis. 7. Mild mediastinal and bilateral hilar adenopathy with minimal progression, nonspecific. Aortic Atherosclerosis (ICD10-I70.0). Electronically Signed   By: Beckie Salts M.D.   On: 03/11/2023 18:50   DG Chest 2 View Result Date: 03/11/2023 CLINICAL DATA:  Chest pain EXAM: CHEST - 2 VIEW COMPARISON:  X-ray 03/04/2022 and older.  CT 10/13/2022 and older FINDINGS: No consolidation, pneumothorax or effusion. No edema. Normal cardiopericardial silhouette. Degenerative changes along the spine. Films are under penetrated. IMPRESSION: Under penetrated radiographs.  No acute cardiopulmonary disease Electronically Signed   By: Karen Kays M.D.   On: 03/11/2023 13:12    Cardiac Studies     Patient Profile     Manuel [name] is a 67 y.o. male with a hx of HTN, HLD, Type II DM and alcohol abuse who is being seen 03/12/2023 for the evaluation of chest pain at the request of Dr. Gwenlyn [facility].   Assessment & Plan    1.Noncardiac chest pains - symptmos not cardiac. Started after eating lunch, lasting over 48 hrs constant and has a positional component.  - no objective evidence of ischemia by EKG or enzymes - f/u echo, at this time no plans for ischemic testing    2. SOB/DOE/LE edema - echo LVEF 65-70%, no WMAs, indet diastolic, elevated LA  pressure, mild LAE, normal RV, aortic sclerosis without stenosis - some signs of fluid overload though difficult exam due to body habitus.  - BNP 34 may be underestimated given body habitus. Very small pleural effusions on CT - reds vest 32% yesterday  - given IV lasix 40mg  x 1 yesterday, I/Os incomplete. Weights appear inaccurate. Labs are pending. Subjectively  reports only urinated once after IV lasix. - ongoing SOB. F/u labs, pending results likely redose IV lasix. Borderline temp, some cough this AM. F/u WBC.    3. SVT - episode yesterday, started on lopressor. No recurrence - f/u electrolytes  For questions or updates, please contact [facility] Please consult [email or web address] for contact info under        Signed, Dina Rich, MD  03/13/2023, 8:17 AM

## 2023-03-13 NOTE — Progress Notes (Signed)
   03/13/23 1200  ReDS Vest / Clip  Station Marker D  Ruler Value 44  ReDS Value Range < 36  ReDS Actual Value 31

## 2023-03-13 NOTE — Progress Notes (Signed)
SATURATION QUALIFICATIONS: (This note is used to comply with regulatory documentation for home oxygen)  Patient Saturations on Room Air at Rest = 90%  Patient Saturations on Room Air while Ambulating = 86%  Patient Saturations on 2 Liters of oxygen while Ambulating = 92%  Please briefly explain why patient needs home oxygen: Dyspnea with exertion

## 2023-03-13 NOTE — Progress Notes (Signed)
Mobility Specialist Progress Note:    03/13/23 1603  Mobility  Activity Ambulated with assistance in hallway  Level of Assistance Standby assist, set-up cues, supervision of patient - no hands on  Assistive Device None  Distance Ambulated (ft) 100 ft  Range of Motion/Exercises Active;All extremities  Activity Response Tolerated well  Mobility Referral Yes  Mobility visit 1 Mobility  Mobility Specialist Start Time (ACUTE ONLY) 1540  Mobility Specialist Stop Time (ACUTE ONLY) 1600  Mobility Specialist Time Calculation (min) (ACUTE ONLY) 20 min   Finished O2 sat test with pt, required SBA to stand and ambulate with no AD. Tolerated well, SpO2 92% on 2L during ambulation. Audible SOB. Returned to room, left pt supine. Notified nurse, all needs met.    Lawerance Bach Mobility Specialist Please contact via Special educational needs teacher or  Rehab office at [phone]

## 2023-03-13 NOTE — Progress Notes (Signed)
Progress Note   Patient: Manuel [name] RUE:[id number] DOB: 10/22/1955 DOA: 03/11/2023     0 DOS: the patient was seen and examined on 03/13/2023   Brief hospital mission course: Manuel [name] is a 67 y.o. male with hx of hypertension, hyperlipidemia, diabetes, alcohol abuse, who presents with sudden onset of epigastric pain /lower chest pain.  Reports sudden onset of symptoms Monday afternoon while he was at rest.  Did not bring this up on our interview but per initial EDP notes that this occurred after eating lunch.  Pain has been constant since onset.  Described as sharp.  Not worsened with p.o. intake.  Feels it is worse when he is up and moving/ambulating on flat ground, or twisting to the left.  Reports chronic dyspnea on exertion when walking short distances.  No orthopnea.  Does have chronic lower extremity edema. he denies any recent strenuous activity, lifting, other MSK injury.  Has not had similar symptoms in the past.  Denies taking OTC med/NSAIDs for pain.  Notably has heavy alcohol use at 8 beers per day, no history of prior withdrawals.    Assessment and Plan: 1-epigastric pain/chest pain -Acute ischemic process has been rule out -Negative troponin, no EKG telemetry abnormalities; 2D echo reassuring with no wall motion abnormalities, preserved ejection fraction and mild left ventricular hypertrophy -PPI adjusted to twice a day for better symptomatic management -Patient advised to stop drinking.  2-acute respiratory failure with hypoxia  -mildly productive coughing spells reported -WBCs elevated -CT scan not demonstrating frank opacity but with some bronchitis changes -Empirical oral antibiotic has been started -Will wean off oxygen supplementation -Following cardiology service recommendation and signs of fluid overload appreciated; Will provide treatment with IV Lasix, low-sodium diet and follow daily weights. -Reds clip around 36.  3-alcohol abuse -No signs of acute  withdrawal -Continue thiamine, folate and as needed Ativan per CIWA protocol. -Cessation counseling provided.  4-mild hyponatremia -Appears to be associated with alcohol consumption and mild hemodilution with hypervolemia -Diuresis following recommendations by cardiology service -Follow electrolytes trend.  5-hypertension/SVT -Continue treatment with Lopressor -Planning to resume losartan at discharge -Patient also receiving Lasix -Follow vital signs.  6-history of BPH -Continue treatment Flomax.  7-history of gout -Continue home follow-up with renal dose.  8-depression -Continue treatment with Cymbalta.  9-type 2 diabetes mellitus: -A1c 7.5 -Holding oral hypoglycemic agents while inpatient -Continue treatment with a sliding scale insulin. -Follow CBG fluctuation.  10-class III obesity -Low-calorie diet, portion control and increase full activity discussed with patient. -Body mass index is 43.59 kg/m.   Subjective:  Currently no complaining of chest pain; experiencing mildly productive coughing spells and shortness of breath.  2 L nasal cannula in place.  Physical Exam: Vitals:   03/13/23 0407 03/13/23 0859 03/13/23 1351 03/13/23 1600  BP: 136/67 126/60 (!) 145/68   Pulse: 93 92 99   Resp: 20 19 19    Temp: 99 F (37.2 C) 99.5 F (37.5 C) (!) 100.5 F (38.1 C) 99.7 F (37.6 C)  TempSrc:    Oral  SpO2: 94% 94% 94%   Weight: (!) 154 kg     Height:       General exam: Alert, awake, oriented x 3; obese, in no major distress.  Demonstrating no acute withdrawal symptoms. Respiratory system: Positive rhonchi bilaterally; decreased breath sounds at the bases.  Feeling short winded with activity and demonstrating tachypnea.  2 L nasal cannula in place. Cardiovascular system:RRR.  No rubs, no gallops, unable to see JVD with body  habitus. Gastrointestinal system: Abdomen is obese, nondistended, soft and nontender. No organomegaly or masses felt. Normal bowel sounds  heard. Central nervous system: No focal neurological deficits. Extremities: No cyanosis or clubbing; 1-2+ edema appreciated bilaterally. Skin: No petechiae. Psychiatry: Judgement and insight appear normal. Mood & affect appropriate.   Data Reviewed: CBC: WBC 13.4, hemoglobin 13.3 and platelet count 143K Magnesium: 2.1 Basic metabolic panel: Sodium 131, potassium 4.1, chloride 98, bicarb 22, glucose 234, BUN 17, creatinine 1.14 and GFR >60   Family Communication: Wife updated at bedside.  Disposition: Status is: Inpatient Remains inpatient appropriate because: Continue treatment with IV diuresis.   Planned Discharge Destination: Home  Time spent: 50 minutes  Author: Vassie Loll, MD 03/13/2023 5:03 PM  For on call review [email or web address].

## 2023-03-13 NOTE — Inpatient Diabetes Management (Signed)
Inpatient Diabetes Program Recommendations  AACE/ADA: New Consensus Statement on Inpatient Glycemic Control (2015)  Target Ranges:  Prepandial:   less than 140 mg/dL      Peak postprandial:   less than 180 mg/dL (1-2 hours)      Critically ill patients:  140 - 180 mg/dL   Lab Results  Component Value Date   GLUCAP 214 (H) 03/13/2023   HGBA1C 7.5 (H) 03/12/2023    Review of Glycemic Control  Latest Reference Range & Units 03/12/23 16:52 03/12/23 19:52 03/13/23 07:23  Glucose-Capillary 70 - 99 mg/dL 469 (H) 629 (H) 528 (H)  (H): Data is abnormally high  Diabetes history: DM 2 Outpatient Diabetes medications:  Mounjaro 5 mg weekly Metformin 500 mg bid Current orders for Inpatient glycemic control:  Novolog 0-6 units tid with meals, Semglee 12 units QHS  Inpatient Diabetes Program Recommendations:    Please consider:  Novolog 0-15 units TID  Semglee 15 units at bedtime  Will continue to follow while inpatient.  Thank you, Dulce Sellar, MSN, CDCES Diabetes Coordinator Inpatient Diabetes Program [phone] (team pager from 8a-5p)

## 2023-03-13 NOTE — Plan of Care (Signed)

## 2023-03-14 DIAGNOSIS — R0600 Dyspnea, unspecified: Secondary | ICD-10-CM

## 2023-03-14 DIAGNOSIS — M1712 Unilateral primary osteoarthritis, left knee: Secondary | ICD-10-CM

## 2023-03-14 DIAGNOSIS — E118 Type 2 diabetes mellitus with unspecified complications: Secondary | ICD-10-CM | POA: Diagnosis not present

## 2023-03-14 DIAGNOSIS — J40 Bronchitis, not specified as acute or chronic: Secondary | ICD-10-CM

## 2023-03-14 DIAGNOSIS — K219 Gastro-esophageal reflux disease without esophagitis: Secondary | ICD-10-CM

## 2023-03-14 DIAGNOSIS — R079 Chest pain, unspecified: Secondary | ICD-10-CM | POA: Diagnosis not present

## 2023-03-14 DIAGNOSIS — J9601 Acute respiratory failure with hypoxia: Secondary | ICD-10-CM

## 2023-03-14 LAB — GLUCOSE, CAPILLARY
Glucose-Capillary: 210 mg/dL — ABNORMAL HIGH (ref 70–99)
Glucose-Capillary: 260 mg/dL — ABNORMAL HIGH (ref 70–99)
Glucose-Capillary: 356 mg/dL — ABNORMAL HIGH (ref 70–99)

## 2023-03-14 LAB — BASIC METABOLIC PANEL
Anion gap: 11 (ref 5–15)
BUN: 18 mg/dL (ref 8–23)
CO2: 24 mmol/L (ref 22–32)
Calcium: 8.7 mg/dL — ABNORMAL LOW (ref 8.9–10.3)
Chloride: 96 mmol/L — ABNORMAL LOW (ref 98–111)
Creatinine, Ser: 1.25 mg/dL — ABNORMAL HIGH (ref 0.61–1.24)
GFR, Estimated: >60 mL/min (ref >60)
Glucose, Bld: 221 mg/dL — ABNORMAL HIGH (ref 70–99)
Potassium: 3.8 mmol/L (ref 3.5–5.1)
Sodium: 131 mmol/L — ABNORMAL LOW (ref 135–145)

## 2023-03-14 MED ORDER — VITAMIN B-1 100 MG PO TABS: 100.0000 mg | ORAL_TABLET | Freq: Every day | ORAL | 2 refills | Status: AC

## 2023-03-14 MED ORDER — TORSEMIDE 20 MG PO TABS: 20.0000 mg | ORAL_TABLET | Freq: Every day | ORAL | Status: DC

## 2023-03-14 MED ORDER — PANTOPRAZOLE SODIUM 40 MG PO TBEC: 40.0000 mg | DELAYED_RELEASE_TABLET | Freq: Two times a day (BID) | ORAL | 2 refills | Status: AC

## 2023-03-14 MED ORDER — FOLIC ACID 1 MG PO TABS: 1.0000 mg | ORAL_TABLET | Freq: Every day | ORAL | 2 refills | Status: AC

## 2023-03-14 MED ORDER — ENOXAPARIN SODIUM 80 MG/0.8ML IJ SOSY: 80.0000 mg | PREFILLED_SYRINGE | INTRAMUSCULAR | Status: DC

## 2023-03-14 MED ORDER — TORSEMIDE 20 MG PO TABS: 20.0000 mg | ORAL_TABLET | Freq: Every day | ORAL | 2 refills | Status: DC

## 2023-03-14 MED ORDER — MELOXICAM 7.5 MG PO TABS: 7.5000 mg | ORAL_TABLET | Freq: Every day | ORAL | Status: DC | PRN

## 2023-03-14 MED ORDER — AMOXICILLIN-POT CLAVULANATE 875-125 MG PO TABS: 1.0000 | ORAL_TABLET | Freq: Two times a day (BID) | ORAL | 0 refills | Status: AC

## 2023-03-14 MED ORDER — METOPROLOL TARTRATE 25 MG PO TABS: 12.5000 mg | ORAL_TABLET | Freq: Two times a day (BID) | ORAL | 1 refills | Status: AC

## 2023-03-14 MED ORDER — DM-GUAIFENESIN ER 30-600 MG PO TB12: 1.0000 | ORAL_TABLET | Freq: Two times a day (BID) | ORAL | 0 refills | Status: AC

## 2023-03-14 NOTE — TOC Transition Note (Signed)
Transition of Care [facility]) - Discharge Note   Patient Details  Name: Manuel [name] MRN: [id number] Date of Birth: Apr 26, 1955  Transition of Care [facility]) CM/SW Contact:  Leitha Bleak, RN Phone Number: 03/14/2023, 1:52 PM  Clinical Narrative:   Patient discharging home, qualifying for home oxygen. CM spoke with his wife to discuss home oxygen. Referral given to [facility] with [employer], RN updated.     Final next level of care: Home/Self Care Barriers to Discharge: Barriers Resolved   Patient Goals and CMS Choice Patient states their goals for this hospitalization and ongoing recovery are:: to go home. CMS Medicare.gov Compare Post Acute Care list provided to:: Patient Represenative (must comment) Choice offered to / list presented to : Spouse    Discharge Placement            Patient and family notified of of transfer: 03/14/23  Discharge Plan and Services Additional resources added to the After Visit Summary for       Post Acute Care Choice: Durable Medical Equipment          DME Arranged: Oxygen DME Agency: [employer] Date DME Agency Contacted: 03/14/23 Time DME Agency Contacted: 1349 Representative spoke with at DME Agency: Ian Malkin      Social Drivers of Health (SDOH) Interventions SDOH Screenings   Food Insecurity: No Food Insecurity (03/12/2023)  Housing: Low Risk  (03/12/2023)  Transportation Needs: No Transportation Needs (03/12/2023)  Utilities: Not At Risk (03/12/2023)  Tobacco Use: Low Risk  (03/11/2023)    Readmission Risk Interventions    03/14/2023    1:52 PM  Readmission Risk Prevention Plan  Post Dischage Appt Not Complete  Medication Screening Complete  Transportation Screening Complete

## 2023-03-14 NOTE — Inpatient Diabetes Management (Signed)
Inpatient Diabetes Program Recommendations  AACE/ADA: New Consensus Statement on Inpatient Glycemic Control   Target Ranges:  Prepandial:   less than 140 mg/dL      Peak postprandial:   less than 180 mg/dL (1-2 hours)      Critically ill patients:  140 - 180 mg/dL    Latest Reference Range & Units 03/13/23 07:23 03/13/23 11:27 03/13/23 17:09 03/13/23 19:52 03/14/23 07:44  Glucose-Capillary 70 - 99 mg/dL 098 (H) 119 (H) 147 (H) 243 (H) 210 (H)   Review of Glycemic Control  Diabetes history: DM2 Outpatient Diabetes medications: Mounjaro 5 mg Qweek, Metformin 500 mg BID Current orders for Inpatient glycemic control: Semglee 12 units at bedtime, Novolog 0-6 units TID with meals  Inpatient Diabetes Program Recommendations:    Insulin: Glucose ranged from 210-243 mg/dl over the past 24 hours. Please consider increasing Semglee to 15 units at bedtime and Novolog correction to 0-15 units TID with meals.  Thanks, Orlando Penner, RN, MSN, CDCES Diabetes Coordinator Inpatient Diabetes Program [phone] (Team Pager from 8am to 5pm)

## 2023-03-14 NOTE — Progress Notes (Signed)
SATURATION QUALIFICATIONS: (This note is used to comply with regulatory documentation for home oxygen)  Patient Saturations on Room Air at Rest = 90%  Patient Saturations on Room Air while Ambulating = 86%  Patient Saturations on 2 Liters of oxygen while Ambulating = 95%  Please briefly explain why patient needs home oxygen: Pts oxygen saturation dropped below 90% while ambulating.

## 2023-03-14 NOTE — Discharge Summary (Signed)
Physician Discharge Summary   Patient: Manuel [name] MRN: [id number] DOB: Jul 28, 1955  Admit date:     03/11/2023  Discharge date: 03/14/23  Discharge Physician: Vassie Loll   PCP: Kirstie Peri, MD   Recommendations at discharge:  Reassess blood pressure and adjust antihypertensive regimen as needed Continue assisting patient with alcohol cessation and weight loss. Close monitoring to patient's CBGs/A1c with further adjustment to hypoglycemia regimen as needed Make sure patient follow-up with cardiology service as instructed Reassessed patient oxygen desaturation screening and decide the need for further oxygen supplementation.  Discharge Diagnoses: Principal Problem:   Chest pain Active Problems:   Primary osteoarthritis of left knee   Type 2 diabetes mellitus with complication, without long-term current use of insulin (HCC)   Gastroesophageal reflux disease   Bronchitis   Acute respiratory failure with hypoxia (HCC)   Brief hospital mission course: Manuel [name] is a 66 y.o. male with hx of hypertension, hyperlipidemia, diabetes, alcohol abuse, who presents with sudden onset of epigastric pain /lower chest pain.  Reports sudden onset of symptoms Monday afternoon while he was at rest.  Did not bring this up on our interview but per initial EDP notes that this occurred after eating lunch.  Pain has been constant since onset.  Described as sharp.  Not worsened with p.o. intake.  Feels it is worse when he is up and moving/ambulating on flat ground, or twisting to the left.  Reports chronic dyspnea on exertion when walking short distances.  No orthopnea.  Does have chronic lower extremity edema. he denies any recent strenuous activity, lifting, other MSK injury.  Has not had similar symptoms in the past.  Denies taking OTC med/NSAIDs for pain.  Notably has heavy alcohol use at 8 beers per day, no history of prior withdrawals.  Assessment and Plan: 1-epigastric pain/chest pain -Acute  ischemic process has been rule out -Negative troponin, no EKG telemetry abnormalities; 2D echo reassuring with no wall motion abnormalities, preserved ejection fraction and mild left ventricular hypertrophy -PPI adjusted to twice a day for better symptomatic management; lifestyle changes discussed with patient. -Patient advised to stop drinking.   2-acute respiratory failure with hypoxia  -mildly productive coughing spells reported -WBCs elevated; repeat CBC to follow WBCs trend/stability. -CT scan not demonstrating frank opacity but with some bronchitis changes -Patient will complete empirical oral antibiotic for underlying bronchitis. -Patient received diuresis for fluid overload once creatinine starting to bump. -Discharged on Lasix 20 mg daily -Low-sodium diet, daily weights hydration discussed with patient. -Reds clip around 36 at time of discharge.. -2 L of oxygen supplementation required at discharge; there is also mild concern for obesity hypoventilation syndrome.   3-alcohol abuse -No signs of acute withdrawal -Continue thiamine, folate and as needed Ativan per CIWA protocol. -Cessation counseling provided.   4-mild hyponatremia -Appears to be associated with alcohol consumption and mild hemodilution with hypervolemia. -Patient has been discharged on Lasix 20 mg daily. -For the most part stabilized and patient asymptomatic at discharge -Continue to follow electrolytes trend.   5-hypertension/SVT -Stable and well-controlled -At discharge patient will continue the use of losartan, Lopressor and daily Lasix as recommended by cardiology service -Heart healthy diet discussed with patient. -Reassess blood pressure and adjust antihypertensive treatment as needed.   6-history of BPH -Continue treatment Flomax and Rapaflo. -Continue patient follow-up with urology service.   7-history of gout -Continue home follow-up with renal dose. -No acute flare currently appreciated.    8-depression -Continue treatment with Cymbalta. -No suicidal ideation hallucination.  9-type 2 diabetes mellitus: -A1c 7.5 -Continue to follow CBGs fluctuation and further adjust hypoglycemic regimen as needed -At discharge resume the use of metformin and weekly Mounjaro -Modified carbohydrate diet and weight loss recommended.  10-class III obesity -Low-calorie diet, portion control and increase full activity discussed with patient. -Body mass index is 42.66 kg/m.   Consultants: Cardiology service. Procedures performed: Stable and improved. Disposition: Home Diet recommendation: Heart healthy, modified carbohydrates and low calorie diet.  DISCHARGE MEDICATION: Allergies as of 03/14/2023   No Known Allergies      Medication List     STOP taking these medications    amLODipine 5 MG tablet Commonly known as: NORVASC   atenolol 25 MG tablet Commonly known as: TENORMIN   silodosin 8 MG Caps capsule Commonly known as: RAPAFLO       TAKE these medications    alfuzosin 10 MG 24 hr tablet Commonly known as: UROXATRAL Take 10 mg by mouth at bedtime.   allopurinol 300 MG tablet Commonly known as: ZYLOPRIM Take 300 mg by mouth daily.   amoxicillin-clavulanate 875-125 MG tablet Commonly known as: AUGMENTIN Take 1 tablet by mouth every 12 (twelve) hours for 6 days.   aspirin EC 81 MG tablet Take 81 mg by mouth daily. Swallow whole.   dextromethorphan-guaiFENesin 30-600 MG 12hr tablet Commonly known as: MUCINEX DM Take 1 tablet by mouth 2 (two) times daily for 10 days.   DULoxetine 30 MG capsule Commonly known as: CYMBALTA Take 30 mg by mouth daily.   folic acid 1 MG tablet Commonly known as: FOLVITE Take 1 tablet (1 mg total) by mouth daily. Start taking on: March 15, 2023   losartan 100 MG tablet Commonly known as: COZAAR Take 100 mg by mouth daily.   meloxicam 7.5 MG tablet Commonly known as: MOBIC Take 1 tablet (7.5 mg total) by mouth daily as  needed (moderate to severe pain). What changed:  when to take this reasons to take this   metFORMIN 500 MG tablet Commonly known as: GLUCOPHAGE Take 1 tablet (500 mg total) by mouth 2 (two) times daily with a meal. What changed: when to take this   metoprolol tartrate 25 MG tablet Commonly known as: LOPRESSOR Take 0.5 tablets (12.5 mg total) by mouth 2 (two) times daily.   Mounjaro 5 MG/0.5ML Pen Generic drug: tirzepatide Inject 5 mg into the skin once a week.   pantoprazole 40 MG tablet Commonly known as: PROTONIX Take 1 tablet (40 mg total) by mouth 2 (two) times daily.   pregabalin 300 MG capsule Commonly known as: LYRICA Take 300 mg by mouth 2 (two) times daily.   rosuvastatin 5 MG tablet Commonly known as: CRESTOR Take 5 mg by mouth daily.   tamsulosin 0.4 MG Caps capsule Commonly known as: FLOMAX Take 1 capsule (0.4 mg total) by mouth daily after supper.   thiamine 100 MG tablet Commonly known as: Vitamin B-1 Take 1 tablet (100 mg total) by mouth daily. Start taking on: March 15, 2023   torsemide 20 MG tablet Commonly known as: DEMADEX Take 1 tablet (20 mg total) by mouth daily. Start taking on: March 15, 2023   triamcinolone cream 0.1 % Commonly known as: KENALOG Apply 1 Application topically 2 (two) times daily.               Durable Medical Equipment  (From admission, onward)           Start     Ordered   03/14/23 [phone]  For home use only DME oxygen  Once       Question Answer Comment  Length of Need 12 Months   Mode or (Route) Nasal cannula   Liters per Minute 2   Frequency Continuous (stationary and portable oxygen unit needed)   Oxygen conserving device Yes   Oxygen delivery system Gas      03/14/23 0855            Follow-up Information     Kirstie Peri, MD. Schedule an appointment as soon as possible for a visit in 10 day(s).   Specialty: Internal Medicine Contact information: [address]  [place]  [address] [phone]         [facility] Follow up.   Why: home oxygen will be delivered Contact information: [address] [place] [address] [phone]                Discharge Exam: Ceasar Mons Weights   03/12/23 0500 03/13/23 0407 03/14/23 0300  Weight: (!) 157.9 kg (!) 154 kg (!) 150.7 kg   General exam: Alert, awake, oriented x 3; reports breathing easier and feeling ready to go home.  Good urine output reported. Respiratory system: Positive scattered rhonchi; no wheezing, no using accessory muscles.  2 L nasal cannula supplementation in place. Cardiovascular system:RRR. No rubs or gallops; unable to assess JVD with body habitus. Gastrointestinal system: Abdomen is obese, nondistended, soft and nontender. No organomegaly or masses felt. Normal bowel sounds heard. Central nervous system:  No focal neurological deficits. Extremities: No cyanosis or clubbing; trace edema appreciated bilaterally. Skin: No petechiae. Psychiatry: Judgement and insight appear normal. Mood & affect appropriate.    Condition at discharge: Stable and improved.  The results of significant diagnostics from this hospitalization (including imaging, microbiology, ancillary and laboratory) are listed below for reference.   Imaging Studies: ECHOCARDIOGRAM COMPLETE Result Date: 03/12/2023    ECHOCARDIOGRAM REPORT   Patient Name:   Manuel [name] Date of Exam: 03/12/2023 Medical Rec #:  [id number]      Height:       74.0 in Accession #:    [id number]     Weight:       348.1 lb Date of Birth:  [birth year]       BSA:          2.751 m Patient Age:    67 years       BP:           94/45 mmHg Patient Gender: M              HR:           80 bpm. [facility] Procedure: 2D Echo, Cardiac Doppler, Color Doppler and Intracardiac            Opacification Agent Indications:    Congestive Heart Failure I50.9                 Murmur R01.1  History:        Patient has no prior  history of Echocardiogram examinations.                 Risk Factors:Hypertension, Dyslipidemia and CKD.  Sonographer:    Celesta Gentile RCS Referring Phys: [id number] JONATHAN SEGARS IMPRESSIONS  1. Left ventricular ejection fraction, by estimation, is 65 to 70%. The left ventricle has normal function. The left ventricle has no regional wall motion abnormalities. There is mild left ventricular hypertrophy. Left ventricular  diastolic parameters are indeterminate. Elevated left atrial pressure.  2. Right ventricular systolic function is normal. The right ventricular size is normal. Tricuspid regurgitation signal is inadequate for assessing PA pressure.  3. Left atrial size was mildly dilated.  4. The mitral valve was not well visualized. No evidence of mitral valve regurgitation. No evidence of mitral stenosis.  5. The aortic valve was not well visualized. There is mild calcification of the aortic valve. There is mild thickening of the aortic valve. Aortic valve regurgitation is not visualized. Aortic valve sclerosis is present, with no evidence of aortic valve  stenosis. FINDINGS  Left Ventricle: Left ventricular ejection fraction, by estimation, is 65 to 70%. The left ventricle has normal function. The left ventricle has no regional wall motion abnormalities. Definity contrast agent was given IV to delineate the left ventricular  endocardial borders. The left ventricular internal cavity size was normal in size. There is mild left ventricular hypertrophy. Left ventricular diastolic parameters are indeterminate. Elevated left atrial pressure. Right Ventricle: The right ventricular size is normal. Right vetricular wall thickness was not well visualized. Right ventricular systolic function is normal. Tricuspid regurgitation signal is inadequate for assessing PA pressure. Left Atrium: Left atrial size was mildly dilated. Right Atrium: Right atrial size was normal in size. Pericardium: There is no evidence of pericardial  effusion. Mitral Valve: The mitral valve was not well visualized. No evidence of mitral valve regurgitation. No evidence of mitral valve stenosis. Tricuspid Valve: The tricuspid valve is not well visualized. Tricuspid valve regurgitation is not demonstrated. No evidence of tricuspid stenosis. Aortic Valve: The aortic valve was not well visualized. There is mild calcification of the aortic valve. There is mild thickening of the aortic valve. There is mild aortic valve annular calcification. Aortic valve regurgitation is not visualized. Aortic valve sclerosis is present, with no evidence of aortic valve stenosis. Aortic valve mean gradient measures 5.6 mmHg. Aortic valve peak gradient measures 10.5 mmHg. Aortic valve area, by VTI measures 3.13 cm. Pulmonic Valve: The pulmonic valve was not well visualized. Pulmonic valve regurgitation is not visualized. No evidence of pulmonic stenosis. Aorta: The aortic root is normal in size and structure. Venous: The inferior vena cava was not well visualized. IAS/Shunts: The interatrial septum was not well visualized.  LEFT VENTRICLE PLAX 2D LVIDd:         4.40 cm   Diastology LVIDs:         3.00 cm   LV e' medial:    7.62 cm/s LV PW:         1.20 cm   LV E/e' medial:  15.5 LV IVS:        1.10 cm   LV e' lateral:   8.59 cm/s LVOT diam:     2.10 cm   LV E/e' lateral: 13.7 LV SV:         108 LV SV Index:   39 LVOT Area:     3.46 cm  RIGHT VENTRICLE RV S prime:     13.60 cm/s TAPSE (M-mode): 2.5 cm LEFT ATRIUM              Index        RIGHT ATRIUM           Index LA diam:        3.60 cm  1.31 cm/m   RA Area:     19.20 cm LA Vol (A2C):   95.0 ml  34.54 ml/m  RA Volume:   60.00 ml  21.81 ml/m LA Vol (A4C):   116.0 ml 42.17 ml/m LA Biplane Vol: 107.0 ml 38.90 ml/m  AORTIC VALVE AV Area (Vmax):    3.05 cm AV Area (Vmean):   3.30 cm AV Area (VTI):     3.13 cm AV Vmax:           162.14 cm/s AV Vmean:          111.337 cm/s AV VTI:            0.345 m AV Peak Grad:      10.5 mmHg  AV Mean Grad:      5.6 mmHg LVOT Vmax:         143.00 cm/s LVOT Vmean:        106.000 cm/s LVOT VTI:          0.312 m LVOT/AV VTI ratio: 0.90  AORTA Ao Root diam: 3.50 cm MITRAL VALVE MV Area (PHT): 4.31 cm     SHUNTS MV Decel Time: 176 msec     Systemic VTI:  0.31 m MV E velocity: 118.00 cm/s  Systemic Diam: 2.10 cm MV A velocity: 92.10 cm/s MV E/A ratio:  1.28 Dina Rich MD Electronically signed by Dina Rich MD Signature Date/Time: 03/12/2023/3:21:55 PM    Final    CT Angio Chest PE W and/or Wo Contrast Result Date: 03/11/2023 CLINICAL DATA:  Midsternal chest pain since yesterday afternoon with shortness of breath and nonproductive cough. EXAM: CT ANGIOGRAPHY CHEST WITH CONTRAST TECHNIQUE: Multidetector CT imaging of the chest was performed using the standard protocol during bolus administration of intravenous contrast. Multiplanar CT image reconstructions and MIPs were obtained to evaluate the vascular anatomy. RADIATION DOSE REDUCTION: This exam was performed according to the departmental dose-optimization program which includes automated exposure control, adjustment of the mA and/or kV according to patient size and/or use of iterative reconstruction technique. CONTRAST:  75mL OMNIPAQUE IOHEXOL 350 MG/ML SOLN COMPARISON:  Chest radiographs obtained earlier today. Chest CT dated 10/13/2022. FINDINGS: Cardiovascular: Atheromatous calcifications, including the coronary arteries and aorta. Normally opacified pulmonary arteries with no pulmonary arterial filling defects seen. Mildly enlarged heart. Prominent epicardial fat. No pericardial effusion. Mediastinum/Nodes: Stable to slightly increased enlarged right hilar lymph nodes, the largest with a short axis diameter of 20 mm on image number 123/5, previously 19 mm. Mild increase in size of an enlarged left hilar lymph node with a short axis diameter of 13 mm on image number 136/5, previously 10 mm. Mildly enlarged and borderline enlarged mediastinal  nodes. The largest is a right paratracheal node with a short axis diameter of 11 mm on image number 57/5, previously 8 mm. Unremarkable thyroid gland, trachea and esophagus. Lungs/Pleura: Very small right pleural effusion and minimal left pleural effusion. Mild bilateral dependent atelectasis. Upper Abdomen: Progressive diffuse low density of the liver. 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 Review of the MIP images confirms the above findings. IMPRESSION: 1. No pulmonary emboli. 2. Very small right pleural effusion and minimal left pleural effusion. 3. Mild bilateral dependent atelectasis. 4. Mild cardiomegaly. 5. Progressive diffuse hepatic steatosis. 6.  Calcific coronary artery and aortic atherosclerosis. 7. Mild mediastinal and bilateral hilar adenopathy with minimal progression, nonspecific. Aortic Atherosclerosis (ICD10-I70.0). Electronically Signed   By: Beckie Salts M.D.   On: 03/11/2023 18:50   DG Chest 2 View Result Date: 03/11/2023 CLINICAL DATA:  Chest pain EXAM: CHEST - 2 VIEW COMPARISON:  X-ray 03/04/2022 and older.  CT 10/13/2022 and older FINDINGS: No  consolidation, pneumothorax or effusion. No edema. Normal cardiopericardial silhouette. Degenerative changes along the spine. Films are under penetrated. IMPRESSION: Under penetrated radiographs.  No acute cardiopulmonary disease Electronically Signed   By: Karen Kays M.D.   On: 03/11/2023 13:12    Microbiology: Results for orders placed or performed during the hospital encounter of 03/04/22  Resp Panel by RT-PCR (Flu A&B, Covid) Anterior Nasal Swab     Status: None   Collection Time: 03/04/22  1:20 AM   Specimen: Anterior Nasal Swab  Result Value Ref Range Status   SARS Coronavirus 2 by RT PCR NEGATIVE NEGATIVE Final    Comment: (NOTE) SARS-CoV-2 target nucleic acids are NOT DETECTED.  The SARS-CoV-2 RNA is generally detectable in upper respiratory specimens during the acute  phase of infection. The lowest concentration of SARS-CoV-2 viral copies this assay can detect is 138 copies/mL. A negative result does not preclude SARS-Cov-2 infection and should not be used as the sole basis for treatment or other patient management decisions. A negative result may occur with  improper specimen collection/handling, submission of specimen other than nasopharyngeal swab, presence of viral mutation(s) within the areas targeted by this assay, and inadequate number of viral copies(<138 copies/mL). A negative result must be combined with clinical observations, patient history, and epidemiological information. The expected result is Negative.  Fact Sheet for Patients:  BloggerCourse.com  Fact Sheet for Healthcare Providers:  SeriousBroker.it  This test is no t yet approved or cleared by the [place] FDA and  has been authorized for detection and/or diagnosis of SARS-CoV-2 by FDA under an Emergency Use Authorization (EUA). This EUA will remain  in effect (meaning this test can be used) for the duration of the COVID-19 declaration under Section 564(b)(1) of the Act, 21 U.S.C.section 360bbb-3(b)(1), unless the authorization is terminated  or revoked sooner.       Influenza A by PCR NEGATIVE NEGATIVE Final   Influenza B by PCR NEGATIVE NEGATIVE Final    Comment: (NOTE) The Xpert Xpress SARS-CoV-2/FLU/RSV plus assay is intended as an aid in the diagnosis of influenza from Nasopharyngeal swab specimens and should not be used as a sole basis for treatment. Nasal washings and aspirates are unacceptable for Xpert Xpress SARS-CoV-2/FLU/RSV testing.  Fact Sheet for Patients: BloggerCourse.com  Fact Sheet for Healthcare Providers: SeriousBroker.it  This test is not yet approved or cleared by the [place] FDA and has been authorized for detection and/or diagnosis of  SARS-CoV-2 by FDA under an Emergency Use Authorization (EUA). This EUA will remain in effect (meaning this test can be used) for the duration of the COVID-19 declaration under Section 564(b)(1) of the Act, 21 U.S.C. section 360bbb-3(b)(1), unless the authorization is terminated or revoked.  Performed at [facility], [address], [place], [place] [address]     Labs: CBC: Recent Labs  Lab 03/11/23 1057 03/12/23 0407 03/13/23 0847  WBC 11.8* 13.4* 13.4*  HGB 15.3 13.1 13.3  HCT 44.9 40.1 39.6  MCV 89.3 91.1 91.0  PLT 146* 117* 143*   Basic Metabolic Panel: Recent Labs  Lab 03/11/23 1057 03/12/23 0407 03/13/23 0847 03/14/23 0402  NA 128* 129* 131* 131*  K 4.7 3.9 4.1 3.8  CL 96* 98 98 96*  CO2 24 23 22 24   GLUCOSE 278* 201* 234* 221*  BUN 11 18 17 18   CREATININE 1.10 1.16 1.14 1.25*  CALCIUM 9.0 8.6* 8.7* 8.7*  MG  --  2.0 2.1  --   PHOS  --  4.0  --   --  Liver Function Tests: Recent Labs  Lab 03/12/23 0407  AST 22  ALT 29  ALKPHOS 48  BILITOT 2.2*  PROT 6.8  ALBUMIN 3.1*   CBG: Recent Labs  Lab 03/13/23 1127 03/13/23 1709 03/13/23 1952 03/14/23 0744 03/14/23 1144  GLUCAP 216* 229* 243* 210* 260*    Discharge time spent: greater than 30 minutes.  Signed: Vassie Loll, MD [facility] 03/14/2023

## 2023-03-14 NOTE — Plan of Care (Signed)

## 2023-03-14 NOTE — Progress Notes (Addendum)
Rounding Note    Patient Name: Malyk Austin Date of Encounter: 03/14/2023  [facility] Cardiologist: New  Subjective   SOB improving but not resolved  Inpatient Medications    Scheduled Meds:  allopurinol  300 mg Oral Daily   alum & mag hydroxide-simeth  30 mL Oral Once   amoxicillin-clavulanate  1 tablet Oral Q12H   aspirin EC  81 mg Oral Daily   dextromethorphan-guaiFENesin  1 tablet Oral BID   docusate sodium  100 mg Oral BID   DULoxetine  30 mg Oral Daily   enoxaparin (LOVENOX) injection  75 mg Subcutaneous Q24H   folic acid  1 mg Oral Daily   insulin aspart  0-6 Units Subcutaneous TID WC   insulin glargine-yfgn  12 Units Subcutaneous QHS   metoprolol tartrate  12.5 mg Oral BID   multivitamin with minerals  1 tablet Oral Daily   pantoprazole  40 mg Oral BID   polyethylene glycol  17 g Oral Daily   pregabalin  300 mg Oral BID   rosuvastatin  5 mg Oral QPM   sodium chloride flush  3 mL Intravenous Q12H   tamsulosin  0.4 mg Oral QPC supper   thiamine  100 mg Oral Daily   Or   thiamine  100 mg Intravenous Daily   Continuous Infusions:  PRN Meds: acetaminophen, LORazepam **OR** LORazepam, ondansetron **OR** ondansetron (ZOFRAN) IV   Vital Signs    Vitals:   03/13/23 1600 03/13/23 1955 03/14/23 0300 03/14/23 0406  BP:  130/70  120/62  Pulse:      Resp:  20  20  Temp: 99.7 F (37.6 C) 100 F (37.8 C)  98.3 F (36.8 C)  TempSrc: Oral Oral  Oral  SpO2:  96%  95%  Weight:   (!) 150.7 kg   Height:        Intake/Output Summary (Last 24 hours) at 03/14/2023 0836 Last data filed at 03/14/2023 0300 Gross per 24 hour  Intake 240 ml  Output --  Net 240 ml      03/14/2023    3:00 AM 03/13/2023    4:07 AM 03/12/2023    5:00 AM  Last 3 Weights  Weight (lbs) 332 lb 3.7 oz 339 lb 8.1 oz 348 lb 1.7 oz  Weight (kg) 150.7 kg 154 kg 157.9 kg      Telemetry    NSR - Personally Reviewed  ECG    N/a - Personally Reviewed  Physical Exam    GEN: No acute distress.   Neck: No JVD Cardiac: RRR, no murmurs, rubs, or gallops.  Respiratory: Clear to auscultation bilaterally. GI: Soft, nontender, non-distended  MS: 1+ bilateral LE edema Neuro:  Nonfocal  Psych: Normal affect   Labs    High Sensitivity Troponin:   Recent Labs  Lab 03/11/23 1057 03/11/23 1233  TROPONINIHS 6 6     Chemistry Recent Labs  Lab 03/12/23 0407 03/13/23 0847 03/14/23 0402  NA 129* 131* 131*  K 3.9 4.1 3.8  CL 98 98 96*  CO2 23 22 24   GLUCOSE 201* 234* 221*  BUN 18 17 18   CREATININE 1.16 1.14 1.25*  CALCIUM 8.6* 8.7* 8.7*  MG 2.0 2.1  --   PROT 6.8  --   --   ALBUMIN 3.1*  --   --   AST 22  --   --   ALT 29  --   --   ALKPHOS 48  --   --   BILITOT  2.2*  --   --   GFRNONAA >60 >60 >60  ANIONGAP 8 11 11     Lipids  Recent Labs  Lab 03/12/23 0407  CHOL 96  TRIG 81  HDL 61  LDLCALC 19  CHOLHDL 1.6    Hematology Recent Labs  Lab 03/11/23 1057 03/12/23 0407 03/13/23 0847  WBC 11.8* 13.4* 13.4*  RBC 5.03 4.40 4.35  HGB 15.3 13.1 13.3  HCT 44.9 40.1 39.6  MCV 89.3 91.1 91.0  MCH 30.4 29.8 30.6  MCHC 34.1 32.7 33.6  RDW 12.7 12.7 12.7  PLT 146* 117* 143*   Thyroid No results for input(s): "TSH", "FREET4" in the last 168 hours.  BNP Recent Labs  Lab 03/11/23 1057 03/12/23 0408  BNP 34.0 32.0    DDimer No results for input(s): "DDIMER" in the last 168 hours.   Radiology    ECHOCARDIOGRAM COMPLETE Result Date: 03/12/2023    ECHOCARDIOGRAM REPORT   Patient Name:   LAURA BUSTO Date of Exam: 03/12/2023 Medical Rec #:  [id number]      Height:       74.0 in Accession #:    [id number]     Weight:       348.1 lb Date of Birth:  January 31, 1956       BSA:          2.751 m Patient Age:    67 years       BP:           94/45 mmHg Patient Gender: M              HR:           80 bpm. [facility] Procedure: 2D Echo, Cardiac Doppler, Color Doppler and Intracardiac            Opacification Agent Indications:     Congestive Heart Failure I50.9                 Murmur R01.1  History:        Patient has no prior history of Echocardiogram examinations.                 Risk Factors:Hypertension, Dyslipidemia and CKD.  Sonographer:    Celesta Gentile RCS Referring Phys: [id number] Maynor Mwangi SEGARS IMPRESSIONS  1. Left ventricular ejection fraction, by estimation, is 65 to 70%. The left ventricle has normal function. The left ventricle has no regional wall motion abnormalities. There is mild left ventricular hypertrophy. Left ventricular diastolic parameters are indeterminate. Elevated left atrial pressure.  2. Right ventricular systolic function is normal. The right ventricular size is normal. Tricuspid regurgitation signal is inadequate for assessing PA pressure.  3. Left atrial size was mildly dilated.  4. The mitral valve was not well visualized. No evidence of mitral valve regurgitation. No evidence of mitral stenosis.  5. The aortic valve was not well visualized. There is mild calcification of the aortic valve. There is mild thickening of the aortic valve. Aortic valve regurgitation is not visualized. Aortic valve sclerosis is present, with no evidence of aortic valve  stenosis. FINDINGS  Left Ventricle: Left ventricular ejection fraction, by estimation, is 65 to 70%. The left ventricle has normal function. The left ventricle has no regional wall motion abnormalities. Definity contrast agent was given IV to delineate the left ventricular  endocardial borders. The left ventricular internal cavity size was normal in size. There is mild left ventricular hypertrophy. Left ventricular diastolic parameters are indeterminate. Elevated left atrial  pressure. Right Ventricle: The right ventricular size is normal. Right vetricular wall thickness was not well visualized. Right ventricular systolic function is normal. Tricuspid regurgitation signal is inadequate for assessing PA pressure. Left Atrium: Left atrial size was mildly dilated. Right  Atrium: Right atrial size was normal in size. Pericardium: There is no evidence of pericardial effusion. Mitral Valve: The mitral valve was not well visualized. No evidence of mitral valve regurgitation. No evidence of mitral valve stenosis. Tricuspid Valve: The tricuspid valve is not well visualized. Tricuspid valve regurgitation is not demonstrated. No evidence of tricuspid stenosis. Aortic Valve: The aortic valve was not well visualized. There is mild calcification of the aortic valve. There is mild thickening of the aortic valve. There is mild aortic valve annular calcification. Aortic valve regurgitation is not visualized. Aortic valve sclerosis is present, with no evidence of aortic valve stenosis. Aortic valve mean gradient measures 5.6 mmHg. Aortic valve peak gradient measures 10.5 mmHg. Aortic valve area, by VTI measures 3.13 cm. Pulmonic Valve: The pulmonic valve was not well visualized. Pulmonic valve regurgitation is not visualized. No evidence of pulmonic stenosis. Aorta: The aortic root is normal in size and structure. Venous: The inferior vena cava was not well visualized. IAS/Shunts: The interatrial septum was not well visualized.  LEFT VENTRICLE PLAX 2D LVIDd:         4.40 cm   Diastology LVIDs:         3.00 cm   LV e' medial:    7.62 cm/s LV PW:         1.20 cm   LV E/e' medial:  15.5 LV IVS:        1.10 cm   LV e' lateral:   8.59 cm/s LVOT diam:     2.10 cm   LV E/e' lateral: 13.7 LV SV:         108 LV SV Index:   39 LVOT Area:     3.46 cm  RIGHT VENTRICLE RV S prime:     13.60 cm/s TAPSE (M-mode): 2.5 cm LEFT ATRIUM              Index        RIGHT ATRIUM           Index LA diam:        3.60 cm  1.31 cm/m   RA Area:     19.20 cm LA Vol (A2C):   95.0 ml  34.54 ml/m  RA Volume:   60.00 ml  21.81 ml/m LA Vol (A4C):   116.0 ml 42.17 ml/m LA Biplane Vol: 107.0 ml 38.90 ml/m  AORTIC VALVE AV Area (Vmax):    3.05 cm AV Area (Vmean):   3.30 cm AV Area (VTI):     3.13 cm AV Vmax:            162.14 cm/s AV Vmean:          111.337 cm/s AV VTI:            0.345 m AV Peak Grad:      10.5 mmHg AV Mean Grad:      5.6 mmHg LVOT Vmax:         143.00 cm/s LVOT Vmean:        106.000 cm/s LVOT VTI:          0.312 m LVOT/AV VTI ratio: 0.90  AORTA Ao Root diam: 3.50 cm MITRAL VALVE MV Area (PHT): 4.31 cm     SHUNTS MV Decel Time:  176 msec     Systemic VTI:  0.31 m MV E velocity: 118.00 cm/s  Systemic Diam: 2.10 cm MV A velocity: 92.10 cm/s MV E/A ratio:  1.28 Dina Rich MD Electronically signed by Dina Rich MD Signature Date/Time: 03/12/2023/3:21:55 PM    Final     Cardiac Studies    Patient Profile     Daryll Doto is a 67 y.o. male with a hx of HTN, HLD, Type II DM and alcohol abuse who is being seen 03/12/2023 for the evaluation of chest pain at the request of Dr. Gwenlyn Perking.   Assessment & Plan    1.Noncardiac chest pains - symptmos not cardiac. Started after eating lunch, lasting over 48 hrs constant and has a positional component.  - no objective evidence of ischemia by EKG or enzymes - echo LVEF 65-70%, no WMAs, indet diastolic, elevated LA pressure, mild LAE, normal RV, aortic sclerosis without stenosis     2. SOB/DOE/LE edema - echo LVEF 65-70%, no WMAs, indet diastolic, elevated LA pressure, mild LAE, normal RV, aortic sclerosis without stenosis - some signs of fluid overload though difficult exam due to body habitus.  - BNP 34 may be underestimated given body habitus. Very small pleural effusions on CT - reds vest 32%    - given IV lasix 60mg  x 2 yesterday, I/Os incomplete. Weights appear inaccurate. Reds vest 31% yesterday. Uptrend in Cr today, hold additional diuresis. Can start oral torsemide 20mg  daily tomorrow. Residual LE edema may be more related to obesity, venous insufficiecn     3. SVT - episode yesterday, started on lopressor. No recurrence - f/u electrolytes  4. SOB/Cough - being trated for bronchitis by primary team, working to wean oxygen  No  additional cardiology recs, we will sign off inpatient care and arrange outpatient f/u    For questions or updates, please contact [facility] Please consult [email or web address] for contact info under        Signed, Dina Rich, MD  03/14/2023, 8:36 AM

## 2023-03-24 DIAGNOSIS — R079 Chest pain, unspecified: Secondary | ICD-10-CM | POA: Diagnosis not present

## 2023-03-24 DIAGNOSIS — Z299 Encounter for prophylactic measures, unspecified: Secondary | ICD-10-CM | POA: Diagnosis not present

## 2023-03-24 DIAGNOSIS — J189 Pneumonia, unspecified organism: Secondary | ICD-10-CM | POA: Diagnosis not present

## 2023-03-24 DIAGNOSIS — I10 Essential (primary) hypertension: Secondary | ICD-10-CM | POA: Diagnosis not present

## 2023-03-24 DIAGNOSIS — R52 Pain, unspecified: Secondary | ICD-10-CM | POA: Diagnosis not present

## 2023-03-24 DIAGNOSIS — R2681 Unsteadiness on feet: Secondary | ICD-10-CM | POA: Diagnosis not present

## 2023-04-02 DIAGNOSIS — I517 Cardiomegaly: Secondary | ICD-10-CM | POA: Diagnosis not present

## 2023-04-02 DIAGNOSIS — J811 Chronic pulmonary edema: Secondary | ICD-10-CM | POA: Diagnosis not present

## 2023-04-02 DIAGNOSIS — R0602 Shortness of breath: Secondary | ICD-10-CM | POA: Diagnosis not present

## 2023-04-02 DIAGNOSIS — R5383 Other fatigue: Secondary | ICD-10-CM | POA: Diagnosis not present

## 2023-04-03 DIAGNOSIS — R0602 Shortness of breath: Secondary | ICD-10-CM | POA: Diagnosis not present

## 2023-04-03 DIAGNOSIS — I509 Heart failure, unspecified: Secondary | ICD-10-CM | POA: Diagnosis not present

## 2023-04-03 DIAGNOSIS — E1169 Type 2 diabetes mellitus with other specified complication: Secondary | ICD-10-CM | POA: Diagnosis not present

## 2023-04-03 DIAGNOSIS — R52 Pain, unspecified: Secondary | ICD-10-CM | POA: Diagnosis not present

## 2023-04-03 DIAGNOSIS — I10 Essential (primary) hypertension: Secondary | ICD-10-CM | POA: Diagnosis not present

## 2023-04-03 DIAGNOSIS — Z299 Encounter for prophylactic measures, unspecified: Secondary | ICD-10-CM | POA: Diagnosis not present

## 2023-04-07 DIAGNOSIS — R0602 Shortness of breath: Secondary | ICD-10-CM | POA: Diagnosis not present

## 2023-04-07 DIAGNOSIS — E1169 Type 2 diabetes mellitus with other specified complication: Secondary | ICD-10-CM | POA: Diagnosis not present

## 2023-04-16 ENCOUNTER — Encounter: Payer: Self-pay | Admitting: Student

## 2023-04-16 ENCOUNTER — Other Ambulatory Visit (HOSPITAL_COMMUNITY)
Admission: RE | Admit: 2023-04-16 | Discharge: 2023-04-16 | Disposition: A | Discharge status: Home / Self Care | Payer: Medicare HMO | Source: Ambulatory Visit | Attending: Student | Admitting: Student

## 2023-04-16 ENCOUNTER — Ambulatory Visit: Payer: 59 | Attending: Student | Admitting: Student

## 2023-04-16 VITALS — BP 118/68 | HR 78 | Ht 74.0 in | Wt 310.0 lb

## 2023-04-16 DIAGNOSIS — N179 Acute kidney failure, unspecified: Secondary | ICD-10-CM

## 2023-04-16 DIAGNOSIS — E785 Hyperlipidemia, unspecified: Secondary | ICD-10-CM

## 2023-04-16 DIAGNOSIS — Z79899 Other long term (current) drug therapy: Secondary | ICD-10-CM

## 2023-04-16 DIAGNOSIS — I5020 Unspecified systolic (congestive) heart failure: Secondary | ICD-10-CM | POA: Diagnosis not present

## 2023-04-16 DIAGNOSIS — I471 Supraventricular tachycardia, unspecified: Secondary | ICD-10-CM | POA: Diagnosis not present

## 2023-04-16 DIAGNOSIS — R0789 Other chest pain: Secondary | ICD-10-CM | POA: Diagnosis not present

## 2023-04-16 DIAGNOSIS — R600 Localized edema: Secondary | ICD-10-CM

## 2023-04-16 DIAGNOSIS — I10 Essential (primary) hypertension: Secondary | ICD-10-CM

## 2023-04-16 LAB — BASIC METABOLIC PANEL
Anion gap: 15 (ref 5–15)
BUN: 19 mg/dL (ref 8–23)
CO2: 27 mmol/L (ref 22–32)
Calcium: 8.2 mg/dL — ABNORMAL LOW (ref 8.9–10.3)
Chloride: 92 mmol/L — ABNORMAL LOW (ref 98–111)
Creatinine, Ser: 1.73 mg/dL — ABNORMAL HIGH (ref 0.61–1.24)
GFR, Estimated: 43 mL/min — ABNORMAL LOW (ref >60)
Glucose, Bld: 115 mg/dL — ABNORMAL HIGH (ref 70–99)
Potassium: 2.9 mmol/L — ABNORMAL LOW (ref 3.5–5.1)
Sodium: 134 mmol/L — ABNORMAL LOW (ref 135–145)

## 2023-04-16 LAB — BRAIN NATRIURETIC PEPTIDE: B Natriuretic Peptide: 36 pg/mL (ref 0.0–100.0)

## 2023-04-16 NOTE — Progress Notes (Signed)
Cardiology Office Note    Date:  04/16/2023  ID:  Masiah Broker, DOB [birth year], MRN [id number] Cardiologist: Dina Rich, MD    History of Present Illness:    Manuel [name] is a 68 y.o. male with past medical history of HTN, HLD, Type II DM, alcohol use and recent chest pain who presents to the office today for hospital follow-up.  He was admitted to [facility] in 02/2023 for evaluation of chest discomfort which had mostly been along his epigastric region and was not associated with activity. He was consuming at least 8 - 10 16 ounce beers on a daily basis. Troponin values were negative and echocardiogram showed a preserved EF of 65 to 70% with no regional wall motion abnormalities and no significant valve abnormalities. His  pain was overall felt to be atypical as it had lasted for 48 hours and there were no plans for further ischemic evaluation at that time. He did have 1 episode of SVT and was started on Lopressor 12.5 mg twice daily. Also had mild volume overload and received a few doses of IV Lasix and was started on Torsemide 20 mg daily at discharge.  By review of Care Everywhere, he did have follow-up labs with his PCP on 04/02/2023 which showed his WBC was mildly elevated at 12.6. Hemoglobin was stable at 13.7 with platelets at 270 K. Na+ was significantly low at 121 with K+ at 3.3 and creatinine at 2.87. Glucose was also elevated at 910.  In talking with the patient and his wife today, he reports he did stop consuming alcohol following his hospitalization! Still reports having an intermittent dry cough and has chest discomfort with coughing but no exertional component. He does have follow-up again with his PCP tomorrow to further address this. In regards to his lab results discussed above, his wife reports Torsemide was transitioned to Lasix but they are unsure of any other medication adjustments. He denies any specific palpitations. No recent orthopnea or PND. His lower extremity edema  has significantly improved.  Studies Reviewed:   EKG: EKG is not ordered today.   Echocardiogram: 02/2023 IMPRESSIONS     1. Left ventricular ejection fraction, by estimation, is 65 to 70%. The  left ventricle has normal function. The left ventricle has no regional  wall motion abnormalities. There is mild left ventricular hypertrophy.  Left ventricular diastolic parameters  are indeterminate. Elevated left atrial pressure.   2. Right ventricular systolic function is normal. The right ventricular  size is normal. Tricuspid regurgitation signal is inadequate for assessing  PA pressure.   3. Left atrial size was mildly dilated.   4. The mitral valve was not well visualized. No evidence of mitral valve  regurgitation. No evidence of mitral stenosis.   5. The aortic valve was not well visualized. There is mild calcification  of the aortic valve. There is mild thickening of the aortic valve. Aortic  valve regurgitation is not visualized. Aortic valve sclerosis is present,  with no evidence of aortic valve   stenosis.   Physical Exam:   VS:  BP 118/68   Pulse 78   Ht 6\' 2"  (1.88 m)   Wt (!) 310 lb (140.6 kg)   SpO2 98%   BMI 39.80 kg/m    Wt Readings from Last 3 Encounters:  04/16/23 (!) 310 lb (140.6 kg)  03/14/23 (!) 332 lb 3.7 oz (150.7 kg)  10/13/22 (!) 315 lb (142.9 kg)     GEN: Well nourished, well developed male  appearing in no acute distress NECK: No JVD; No carotid bruits CARDIAC: RRR, no murmurs, rubs, gallops RESPIRATORY:  Clear to auscultation without rales, wheezing or rhonchi  ABDOMEN: Appears non-distended. No obvious abdominal masses. EXTREMITIES: No clubbing or cyanosis. No pitting edema.  Distal pedal pulses are 2+ bilaterally.   Assessment and Plan:   1. Lower Extremity Edema - His symptoms have significantly improved since his hospitalization and I suspect this is due to him reducing his alcohol use as he was previously consuming over 10 beers a day  and stopped following hospital discharge. His wife believes he was switched from Torsemide to Lasix and it was verified with his pharmacy that he has been taking Lasix 40 mg daily. Will recheck a BNP and BMET today as he may require further adjustment of this.   2. SVT - He denies any recent palpitations and says he was overall asymptomatic during his hospitalization. Continue Lopressor 12.5 mg twice daily.  3. History of Atypical Chest Pain - She reports still having episodes of chest discomfort and this only occurs in the setting of coughing. No association with exertion. Recent echocardiogram last month showed a preserved EF as outlined above with no wall motion abnormalities.  If he develops more concerning symptoms over time, could obtain a Coronary CTA.  4. HTN - Blood pressure is well-controlled at 118/68 during today's visit. He is listed as taking Losartan 100 mg daily and Lopressor 12.5 mg twice daily. Will confirm his medication list given recent abnormal BMET.   5. HLD - LDL was at 19 in 02/2023. Remains on Crestor 5 mg daily  6. AKI - Recent labs earlier this month showed his creatinine was significantly elevated at 2.87 and Na+ was low at 121. Will recheck a BMET today. Recommended he avoid Mobic. May need to stop diuretics and/or Losartan pending results.   Signed, Ellsworth Lennox, PA-C

## 2023-04-16 NOTE — Patient Instructions (Addendum)
Medication Instructions:  Your physician recommends that you continue on your current medications as directed. Please refer to the Current Medication list given to you today.   Labwork: BNP,BMET TODAY  Testing/Procedures: None today  Follow-Up: 4-5 months [place], PA-C, or Dr.Branch  Any Other Special Instructions Will Be Listed Below (If Applicable).  If you need a refill on your cardiac medications before your next appointment, please call your pharmacy.     PLEASE CHECK YOUR MEDICATIONS AT HOME AND LET us KNOWN IF YOU ARE TAKINGLandry Mellow MOBIC, CALL [phone]

## 2023-04-17 ENCOUNTER — Telehealth: Payer: Self-pay | Admitting: *Deleted

## 2023-04-17 DIAGNOSIS — I10 Essential (primary) hypertension: Secondary | ICD-10-CM

## 2023-04-17 DIAGNOSIS — N1831 Chronic kidney disease, stage 3a: Secondary | ICD-10-CM | POA: Diagnosis not present

## 2023-04-17 DIAGNOSIS — I509 Heart failure, unspecified: Secondary | ICD-10-CM | POA: Diagnosis not present

## 2023-04-17 DIAGNOSIS — Z299 Encounter for prophylactic measures, unspecified: Secondary | ICD-10-CM | POA: Diagnosis not present

## 2023-04-17 DIAGNOSIS — Z79899 Other long term (current) drug therapy: Secondary | ICD-10-CM

## 2023-04-17 DIAGNOSIS — R059 Cough, unspecified: Secondary | ICD-10-CM | POA: Diagnosis not present

## 2023-04-17 DIAGNOSIS — E1169 Type 2 diabetes mellitus with other specified complication: Secondary | ICD-10-CM | POA: Diagnosis not present

## 2023-04-17 MED ORDER — FUROSEMIDE 20 MG PO TABS: 20.0000 mg | ORAL_TABLET | Freq: Every day | ORAL | 11 refills | Status: DC

## 2023-04-17 NOTE — Telephone Encounter (Signed)
-----   Message from Ellsworth Lennox sent at 04/16/2023  4:11 PM EST ----- Please let the patient know that his fluid level is normal! Sodium level has improved as this was previously at 121 on 04/01/2022 and is now at 134 which is close to normal. Kidney function has also improved as his creatinine was previously at 2.87 and is now down to 1.73. Potassium remains low at 2.9. He is listed as taking Mobic as needed and recommend avoiding this given his kidney function. He is listed as taking Lasix 40 mg daily and would reduce this to 20 mg daily. He is also on K-dur 20 mEq BID and would take an extra 40 mEq today for a total of 60 mEq this evening. Given the dose reduction of Lasix, this should also help with his potassium levels. He needs a repeat BMET within 1 week with [place] or his PCP.

## 2023-04-17 NOTE — Telephone Encounter (Signed)
Wife notified and read back instructions. Orders placed.

## 2023-04-19 DIAGNOSIS — R059 Cough, unspecified: Secondary | ICD-10-CM | POA: Diagnosis not present

## 2023-04-19 DIAGNOSIS — J189 Pneumonia, unspecified organism: Secondary | ICD-10-CM | POA: Diagnosis not present

## 2023-05-19 DIAGNOSIS — I10 Essential (primary) hypertension: Secondary | ICD-10-CM | POA: Diagnosis not present

## 2023-05-19 DIAGNOSIS — N183 Chronic kidney disease, stage 3 unspecified: Secondary | ICD-10-CM | POA: Diagnosis not present

## 2023-05-19 DIAGNOSIS — E1169 Type 2 diabetes mellitus with other specified complication: Secondary | ICD-10-CM | POA: Diagnosis not present

## 2023-05-19 DIAGNOSIS — Z299 Encounter for prophylactic measures, unspecified: Secondary | ICD-10-CM | POA: Diagnosis not present

## 2023-05-19 DIAGNOSIS — R52 Pain, unspecified: Secondary | ICD-10-CM | POA: Diagnosis not present

## 2023-05-19 DIAGNOSIS — I509 Heart failure, unspecified: Secondary | ICD-10-CM | POA: Diagnosis not present

## 2023-05-22 DIAGNOSIS — L84 Corns and callosities: Secondary | ICD-10-CM | POA: Diagnosis not present

## 2023-05-22 DIAGNOSIS — B351 Tinea unguium: Secondary | ICD-10-CM | POA: Diagnosis not present

## 2023-05-22 DIAGNOSIS — M79676 Pain in unspecified toe(s): Secondary | ICD-10-CM | POA: Diagnosis not present

## 2023-05-22 DIAGNOSIS — E1142 Type 2 diabetes mellitus with diabetic polyneuropathy: Secondary | ICD-10-CM | POA: Diagnosis not present

## 2023-06-18 DIAGNOSIS — Z299 Encounter for prophylactic measures, unspecified: Secondary | ICD-10-CM | POA: Diagnosis not present

## 2023-06-18 DIAGNOSIS — I10 Essential (primary) hypertension: Secondary | ICD-10-CM | POA: Diagnosis not present

## 2023-06-18 DIAGNOSIS — N183 Chronic kidney disease, stage 3 unspecified: Secondary | ICD-10-CM | POA: Diagnosis not present

## 2023-06-18 DIAGNOSIS — E1122 Type 2 diabetes mellitus with diabetic chronic kidney disease: Secondary | ICD-10-CM | POA: Diagnosis not present

## 2023-06-18 DIAGNOSIS — E1142 Type 2 diabetes mellitus with diabetic polyneuropathy: Secondary | ICD-10-CM | POA: Diagnosis not present

## 2023-06-18 DIAGNOSIS — E1169 Type 2 diabetes mellitus with other specified complication: Secondary | ICD-10-CM | POA: Diagnosis not present

## 2023-06-18 DIAGNOSIS — I509 Heart failure, unspecified: Secondary | ICD-10-CM | POA: Diagnosis not present

## 2023-07-31 DIAGNOSIS — I509 Heart failure, unspecified: Secondary | ICD-10-CM | POA: Diagnosis not present

## 2023-07-31 DIAGNOSIS — Z1339 Encounter for screening examination for other mental health and behavioral disorders: Secondary | ICD-10-CM | POA: Diagnosis not present

## 2023-07-31 DIAGNOSIS — Z7189 Other specified counseling: Secondary | ICD-10-CM | POA: Diagnosis not present

## 2023-07-31 DIAGNOSIS — B351 Tinea unguium: Secondary | ICD-10-CM | POA: Diagnosis not present

## 2023-07-31 DIAGNOSIS — Z1331 Encounter for screening for depression: Secondary | ICD-10-CM | POA: Diagnosis not present

## 2023-07-31 DIAGNOSIS — F1020 Alcohol dependence, uncomplicated: Secondary | ICD-10-CM | POA: Diagnosis not present

## 2023-07-31 DIAGNOSIS — I10 Essential (primary) hypertension: Secondary | ICD-10-CM | POA: Diagnosis not present

## 2023-07-31 DIAGNOSIS — E1142 Type 2 diabetes mellitus with diabetic polyneuropathy: Secondary | ICD-10-CM | POA: Diagnosis not present

## 2023-07-31 DIAGNOSIS — R52 Pain, unspecified: Secondary | ICD-10-CM | POA: Diagnosis not present

## 2023-07-31 DIAGNOSIS — M79675 Pain in left toe(s): Secondary | ICD-10-CM | POA: Diagnosis not present

## 2023-07-31 DIAGNOSIS — Z299 Encounter for prophylactic measures, unspecified: Secondary | ICD-10-CM | POA: Diagnosis not present

## 2023-07-31 DIAGNOSIS — Z0000 Encounter for general adult medical examination without abnormal findings: Secondary | ICD-10-CM | POA: Diagnosis not present

## 2023-07-31 DIAGNOSIS — M79674 Pain in right toe(s): Secondary | ICD-10-CM | POA: Diagnosis not present

## 2023-07-31 DIAGNOSIS — L84 Corns and callosities: Secondary | ICD-10-CM | POA: Diagnosis not present

## 2023-08-02 ENCOUNTER — Other Ambulatory Visit: Payer: Self-pay | Admitting: Orthopedic Surgery

## 2023-08-02 DIAGNOSIS — M1712 Unilateral primary osteoarthritis, left knee: Secondary | ICD-10-CM

## 2023-08-02 DIAGNOSIS — M48062 Spinal stenosis, lumbar region with neurogenic claudication: Secondary | ICD-10-CM

## 2023-08-02 DIAGNOSIS — M1711 Unilateral primary osteoarthritis, right knee: Secondary | ICD-10-CM

## 2023-08-14 ENCOUNTER — Encounter: Payer: Self-pay | Admitting: Cardiology

## 2023-08-14 ENCOUNTER — Ambulatory Visit: Payer: 59 | Attending: Cardiology | Admitting: Cardiology

## 2023-08-14 VITALS — BP 120/72 | HR 93 | Ht 74.0 in | Wt 316.0 lb

## 2023-08-14 DIAGNOSIS — I5032 Chronic diastolic (congestive) heart failure: Secondary | ICD-10-CM | POA: Diagnosis not present

## 2023-08-14 DIAGNOSIS — R600 Localized edema: Secondary | ICD-10-CM | POA: Diagnosis not present

## 2023-08-14 DIAGNOSIS — I10 Essential (primary) hypertension: Secondary | ICD-10-CM

## 2023-08-14 DIAGNOSIS — E782 Mixed hyperlipidemia: Secondary | ICD-10-CM

## 2023-08-14 NOTE — Patient Instructions (Signed)

## 2023-08-14 NOTE — Progress Notes (Signed)
 Clinical Summary Manuel [name] is a 68 y.o.male  1.Noncardiac chest pains - evaluated during 02/2023 admission - symptmos not cardiac. Started after eating lunch, lasting over 48 hrs constant and has a positional component.  - no objective evidence of ischemia by EKG or enzymes - echo LVEF 65-70%, no WMAs, indet diastolic, elevated LA pressure, mild LAE, normal RV, aortic sclerosis without stenosis  - no recent chest pains.        2. Chronic HFpEF/Lower extremity edmea - echo LVEF 65-70%, no WMAs, indet diastolic, elevated LA pressure, mild LAE, normal RV, aortic sclerosis without stenosis - no SOB/DOE - mild swelling at times, resolves. - compliant with meds       3. SVT -episode during 02/2023 admission - no recent palpitatinos.    4., HLD 01/2023   5. HTN - compliant with meds  6. CKD - monitor by pcp   Past Medical History:  Diagnosis Date   CKD (chronic kidney disease) 01/08/2017   patient states he does not have.   Diabetes mellitus without complication (HCC)    Essential hypertension, benign 01/08/2017   Gout    High cholesterol 01/08/2017   Hypertension    Obesity      No Known Allergies   Current Outpatient Medications  Medication Sig Dispense Refill   alfuzosin (UROXATRAL) 10 MG 24 hr tablet Take 10 mg by mouth at bedtime.     allopurinol  (ZYLOPRIM ) 300 MG tablet Take 300 mg by mouth daily.     aspirin  (ASPIRIN  EC) 81 MG EC tablet Take 81 mg by mouth daily. Swallow whole. 30 tablet 12   DULoxetine  (CYMBALTA ) 30 MG capsule Take 30 mg by mouth daily.     folic acid  (FOLVITE ) 1 MG tablet Take 1 tablet (1 mg total) by mouth daily. 30 tablet 2   furosemide  (LASIX ) 20 MG tablet Take 1 tablet (20 mg total) by mouth daily. 30 tablet 11   insulin  aspart protamine - aspart (NOVOLOG  70/30 FLEXPEN) (70-30) 100 UNIT/ML FlexPen Inject 100 Units into the skin 2 (two) times daily.     insulin  glargine (LANTUS ) 100 UNIT/ML injection Inject 100 Units into  the skin daily.     losartan (COZAAR) 100 MG tablet Take 100 mg by mouth daily.     meloxicam  (MOBIC ) 7.5 MG tablet Take 1 tablet by mouth once daily 30 tablet 0   metFORMIN  (GLUCOPHAGE ) 500 MG tablet Take 1 tablet (500 mg total) by mouth 2 (two) times daily with a meal. (Patient taking differently: Take 500 mg by mouth daily with breakfast.) 60 tablet 0   metoprolol  tartrate (LOPRESSOR ) 25 MG tablet Take 0.5 tablets (12.5 mg total) by mouth 2 (two) times daily. 60 tablet 1   MOUNJARO 5 MG/0.5ML Pen Inject 5 mg into the skin once a week.     pantoprazole  (PROTONIX ) 40 MG tablet Take 1 tablet (40 mg total) by mouth 2 (two) times daily. (Patient taking differently: Take 40 mg by mouth daily.) 60 tablet 2   potassium chloride SA (KLOR-CON M) 20 MEQ tablet Take 20 mEq by mouth 2 (two) times daily.     pregabalin  (LYRICA ) 300 MG capsule Take 300 mg by mouth 2 (two) times daily.     rosuvastatin  (CRESTOR ) 5 MG tablet Take 5 mg by mouth daily.     thiamine  (VITAMIN B-1) 100 MG tablet Take 1 tablet (100 mg total) by mouth daily. 30 tablet 2   triamcinolone cream (KENALOG) 0.1 % Apply 1  Application topically 2 (two) times daily.     No current facility-administered medications for this visit.     Past Surgical History:  Procedure Laterality Date   CIRCUMCISION     COLONOSCOPY N/A 01/14/2017   Procedure: COLONOSCOPY;  Surgeon: Ruby Corporal, MD;  Location: [facility];  Service: Endoscopy;  Laterality: N/A;   CYSTOSCOPY WITH URETHRAL DILATATION N/A 03/28/2022   Procedure: CYSTOSCOPY WITH URETHRAL DILATATION;  Surgeon: Marco Severs, MD;  Location: [facility];  Service: Urology;  Laterality: N/A;   DENTAL SURGERY     teeth removed     No Known Allergies    Family History  Problem Relation Age of Onset   Colon cancer Brother      Social History Manuel [name] reports that he has never smoked. He has never been exposed to tobacco smoke. He has never used smokeless tobacco. Manuel [name]  reports current alcohol  use.    Physical Examination Today's Vitals   08/14/23 0921  BP: 120/72  Pulse: 93  SpO2: 96%  Weight: (!) 316 lb (143.3 kg)  Height: 6\' 2"  (1.88 m)  PainSc: 0-No pain   Body mass index is 40.57 kg/m.  Gen: resting comfortably, no acute distress HEENT: no scleral icterus, pupils equal round and reactive, no palptable cervical adenopathy,  CV: RRR, no mrg, no jvd Resp: Clear to auscultation bilaterally GI: abdomen is soft, non-tender, non-distended, normal bowel sounds, no hepatosplenomegaly MSK: extremities are warm, no edema.  Skin: warm, no rash Neuro:  no focal deficits Psych: appropriate affect     Assessment and Plan  1.Chronic HFpEF/LE edema - no symptoms, euvolemic today - continue lasix  at current dosing  2. SVT - isolated episode during prior admission, no recent symptoms - continue to monitor  3. HTN - at goal, continue curren tmeds  4. HLD - LDL at goal, discussed dietary changes to lower TGs   F/u 6 months      Laurann Pollock, M.D.

## 2023-08-25 ENCOUNTER — Ambulatory Visit (INDEPENDENT_AMBULATORY_CARE_PROVIDER_SITE_OTHER): Admitting: Urology

## 2023-08-25 VITALS — BP 99/65 | HR 105

## 2023-08-25 DIAGNOSIS — R8290 Unspecified abnormal findings in urine: Secondary | ICD-10-CM

## 2023-08-25 MED ORDER — CEPHALEXIN 500 MG PO CAPS: 500.0000 mg | ORAL_CAPSULE | Freq: Two times a day (BID) | ORAL | 0 refills | Status: DC

## 2023-08-25 NOTE — Progress Notes (Signed)
 08/25/2023 11:12 AM   Manuel [name] 10/28/1955 [id number]  Referring provider: Theoplis Fix, MD [address] [place],  [place] [address]  No chief complaint on file.   HPI:  Patient of Dr. Claretta Croft added on for urine odor. Noted over past three weeks. Bothersome only in the sense he worries others can smell. No bladder or dysuria. No fever. IPSS 0.   He is typically seen for BPH and urethral stricture disease.  He is on alfuzosin. Was due for PVR and visit November 2024. Jan 2025 cr 1.73. Typically an infrequent voider.   PMH: Past Medical History:  Diagnosis Date   CKD (chronic kidney disease) 01/08/2017   patient states he does not have.   Diabetes mellitus without complication (HCC)    Essential hypertension, benign 01/08/2017   Gout    High cholesterol 01/08/2017   Hypertension    Obesity     Surgical History: Past Surgical History:  Procedure Laterality Date   CIRCUMCISION     COLONOSCOPY N/A 01/14/2017   Procedure: COLONOSCOPY;  Surgeon: Ruby Corporal, MD;  Location: [facility];  Service: Endoscopy;  Laterality: N/A;   CYSTOSCOPY WITH URETHRAL DILATATION N/A 03/28/2022   Procedure: CYSTOSCOPY WITH URETHRAL DILATATION;  Surgeon: Marco Severs, MD;  Location: [facility];  Service: Urology;  Laterality: N/A;   DENTAL SURGERY     teeth removed    Home Medications:  Allergies as of 08/25/2023   No Known Allergies      Medication List        Accurate as of August 25, 2023 11:12 AM. If you have any questions, ask your nurse or doctor.          alfuzosin 10 MG 24 hr tablet Commonly known as: UROXATRAL Take 10 mg by mouth at bedtime.   allopurinol  300 MG tablet Commonly known as: ZYLOPRIM  Take 300 mg by mouth daily.   aspirin  EC 81 MG tablet Take 81 mg by mouth daily. Swallow whole.   DULoxetine  30 MG capsule Commonly known as: CYMBALTA  Take 30 mg by mouth daily.   folic acid  1 MG tablet Commonly known as: FOLVITE  Take 1 tablet (1 mg total) by  mouth daily.   furosemide  20 MG tablet Commonly known as: Lasix  Take 1 tablet (20 mg total) by mouth daily.   Lantus  100 UNIT/ML injection Generic drug: insulin  glargine Inject 100 Units into the skin daily.   losartan 100 MG tablet Commonly known as: COZAAR Take 100 mg by mouth daily.   meloxicam  7.5 MG tablet Commonly known as: MOBIC  Take 1 tablet by mouth once daily   metFORMIN  500 MG tablet Commonly known as: GLUCOPHAGE  Take 1 tablet (500 mg total) by mouth 2 (two) times daily with a meal. What changed: when to take this   metoprolol  tartrate 25 MG tablet Commonly known as: LOPRESSOR  Take 0.5 tablets (12.5 mg total) by mouth 2 (two) times daily.   Mounjaro 5 MG/0.5ML Pen Generic drug: tirzepatide Inject 5 mg into the skin once a week.   NovoLOG  70/30 FlexPen (70-30) 100 UNIT/ML FlexPen Generic drug: insulin  aspart protamine - aspart Inject 100 Units into the skin 2 (two) times daily.   pantoprazole  40 MG tablet Commonly known as: PROTONIX  Take 1 tablet (40 mg total) by mouth 2 (two) times daily. What changed: when to take this   potassium chloride SA 20 MEQ tablet Commonly known as: KLOR-CON M Take 20 mEq by mouth 2 (two) times daily.   pregabalin  300 MG  capsule Commonly known as: LYRICA  Take 300 mg by mouth 2 (two) times daily.   rosuvastatin  5 MG tablet Commonly known as: CRESTOR  Take 5 mg by mouth daily.   thiamine  100 MG tablet Commonly known as: Vitamin B-1 Take 1 tablet (100 mg total) by mouth daily.   triamcinolone cream 0.1 % Commonly known as: KENALOG Apply 1 Application topically 2 (two) times daily.        Allergies: No Known Allergies  Family History: Family History  Problem Relation Age of Onset   Colon cancer Brother     Social History:  reports that he has never smoked. He has never been exposed to tobacco smoke. His smokeless tobacco use includes chew. He reports current alcohol  use. He reports that he does not use  drugs.   Physical Exam: BP 99/65   Pulse (!) 105   Constitutional:  Alert and oriented, No acute distress. HEENT: [place] AT, moist mucus membranes.  Trachea midline, no masses. Cardiovascular: No clubbing, cyanosis, or edema. Respiratory: Normal respiratory effort, no increased work of breathing. GI: Abdomen is soft, nontender, nondistended, no abdominal masses GU: No CVA tenderness Lymph: No cervical or inguinal lymphadenopathy. Skin: No rashes, bruises or suspicious lesions. Neurologic: Grossly intact, no focal deficits, moving all 4 extremities. Psychiatric: Normal mood and affect.  Laboratory Data: Lab Results  Component Value Date   WBC 13.4 (H) 03/13/2023   HGB 13.3 03/13/2023   HCT 39.6 03/13/2023   MCV 91.0 03/13/2023   PLT 143 (L) 03/13/2023    Lab Results  Component Value Date   CREATININE 1.73 (H) 04/16/2023    No results found for: "PSA"  No results found for: "TESTOSTERONE"  Lab Results  Component Value Date   HGBA1C 7.5 (H) 03/12/2023    Urinalysis    Component Value Date/Time   COLORURINE YELLOW 10/05/2014 1607   APPEARANCEUR Cloudy (A) 02/18/2022 1444   LABSPEC 1.021 10/05/2014 1607   PHURINE 5.0 10/05/2014 1607   GLUCOSEU 1+ (A) 02/18/2022 1444   HGBUR NEGATIVE 10/05/2014 1607   BILIRUBINUR Negative 02/18/2022 1444   KETONESUR trace (5) (A) 07/04/2021 1336   KETONESUR NEGATIVE 10/05/2014 1607   PROTEINUR 1+ (A) 02/18/2022 1444   PROTEINUR NEGATIVE 10/05/2014 1607   UROBILINOGEN 1.0 07/04/2021 1336   UROBILINOGEN 0.2 10/05/2014 1607   NITRITE Negative 02/18/2022 1444   NITRITE NEGATIVE 10/05/2014 1607   LEUKOCYTESUR 1+ (A) 02/18/2022 1444    Lab Results  Component Value Date   LABMICR See below: 02/18/2022   WBCUA 0-5 02/18/2022   LABEPIT 0-10 02/18/2022   MUCUS Present 11/29/2021   BACTERIA Few 02/18/2022    Pertinent Imaging:   Results for orders placed during the hospital encounter of 07/22/17  US  RENAL  Narrative CLINICAL  DATA:  Chronic kidney disease stage 3, obesity  EXAM: RENAL / URINARY TRACT ULTRASOUND COMPLETE  COMPARISON:  CT abdomen pelvis of 10/05/2014  FINDINGS: Right Kidney:  Length: 13.3 cm. No hydronephrosis is seen. The renal parenchymal echogenicity is within normal limits. There is and echogenic focus in the mid upper right kidney of 8 mm in diameter consistent with nonobstructing calculus.  Left Kidney:  Length: 12.6 cm. No hydronephrosis is seen. The parenchyma is normal in echogenicity. No renal calculi are noted.  Bladder:  The urinary bladder is not distended.  Incidentally the echogenicity of the liver appears somewhat inhomogeneous and echogenic which may indicate hepatic steatosis.  IMPRESSION: 1. No hydronephrosis. 2. 8 mm nonobstructing right mid upper renal calculus. 3.  Incidental inhomogeneous echogenic liver parenchyma suggesting hepatic steatosis.   Electronically Signed By: Gerrie Krebs M.D. On: 07/22/2017 16:11  No results found for this or any previous visit.  No results found for this or any previous visit.  Results for orders placed in visit on 11/16/21  CT RENAL STONE STUDY  Narrative CLINICAL DATA:  Nephrolithiasis.  EXAM: CT ABDOMEN AND PELVIS WITHOUT CONTRAST  TECHNIQUE: Multidetector CT imaging of the abdomen and pelvis was performed following the standard protocol without IV contrast.  RADIATION DOSE REDUCTION: This exam was performed according to the departmental dose-optimization program which includes automated exposure control, adjustment of the mA and/or kV according to patient size and/or use of iterative reconstruction technique.  COMPARISON:  10/05/2014  FINDINGS: Lower chest: No acute findings.  Hepatobiliary: Moderate diffuse hepatic steatosis is again demonstrated. No mass visualized on this unenhanced exam. Gallbladder is unremarkable. No evidence of biliary ductal dilatation.  Pancreas: No mass or inflammatory  process visualized on this unenhanced exam.  Spleen:  Within normal limits in size.  Adrenals/Urinary tract: No evidence of urolithiasis or hydronephrosis. Mild diffuse bladder wall thickening, which may be seen with cystitis or chronic bladder outlet obstruction.  Stomach/Bowel: No evidence of obstruction, inflammatory process, or abnormal fluid collections. Normal appendix visualized.  Vascular/Lymphatic: No pathologically enlarged lymph nodes identified. No evidence of abdominal aortic aneurysm. Aortic atherosclerotic calcification incidentally noted.  Reproductive:  No mass or other significant abnormality.  Other:  None.  Musculoskeletal:  No suspicious bone lesions identified.  IMPRESSION: No evidence of urolithiasis or hydronephrosis.  Mild diffuse bladder wall thickening, which may be seen with cystitis or chronic bladder outlet obstruction.  Stable moderate hepatic steatosis.   Electronically Signed By: Marlyce Sine M.D. On: 12/10/2021 13:47   Assessment & Plan:    Urine odor - usually from some bacteria but nothing worrisome. Pt could not leave a specimen today. Discussed nature r/b/a to short course abx and he will start and drink more water  and empty more frequently.   No follow-ups on file.  Christina Coyer, MD  [facility]  [address] [place], [place] [address] [phone]

## 2023-08-30 ENCOUNTER — Other Ambulatory Visit: Payer: Self-pay | Admitting: Urology

## 2023-09-02 ENCOUNTER — Other Ambulatory Visit: Payer: Self-pay | Admitting: Orthopedic Surgery

## 2023-09-02 DIAGNOSIS — M1712 Unilateral primary osteoarthritis, left knee: Secondary | ICD-10-CM

## 2023-09-02 DIAGNOSIS — M48062 Spinal stenosis, lumbar region with neurogenic claudication: Secondary | ICD-10-CM

## 2023-09-02 DIAGNOSIS — M1711 Unilateral primary osteoarthritis, right knee: Secondary | ICD-10-CM

## 2023-09-10 ENCOUNTER — Other Ambulatory Visit: Payer: Self-pay | Admitting: Orthopedic Surgery

## 2023-09-10 DIAGNOSIS — M48062 Spinal stenosis, lumbar region with neurogenic claudication: Secondary | ICD-10-CM

## 2023-09-10 DIAGNOSIS — M1712 Unilateral primary osteoarthritis, left knee: Secondary | ICD-10-CM

## 2023-09-10 DIAGNOSIS — M1711 Unilateral primary osteoarthritis, right knee: Secondary | ICD-10-CM

## 2023-09-10 MED ORDER — MELOXICAM 7.5 MG PO TABS: 7.5000 mg | ORAL_TABLET | Freq: Every day | ORAL | 5 refills | Status: DC

## 2023-09-29 ENCOUNTER — Ambulatory Visit (INDEPENDENT_AMBULATORY_CARE_PROVIDER_SITE_OTHER): Admitting: Urology

## 2023-09-29 ENCOUNTER — Encounter: Payer: Self-pay | Admitting: Urology

## 2023-09-29 VITALS — BP 108/70 | HR 109 | Temp 97.7°F

## 2023-09-29 DIAGNOSIS — N138 Other obstructive and reflux uropathy: Secondary | ICD-10-CM | POA: Diagnosis not present

## 2023-09-29 DIAGNOSIS — R8290 Unspecified abnormal findings in urine: Secondary | ICD-10-CM

## 2023-09-29 DIAGNOSIS — N401 Enlarged prostate with lower urinary tract symptoms: Secondary | ICD-10-CM

## 2023-09-29 LAB — URINALYSIS, ROUTINE W REFLEX MICROSCOPIC
Bilirubin, UA: NEGATIVE
Glucose, UA: NEGATIVE
Ketones, UA: NEGATIVE
Nitrite, UA: NEGATIVE
Protein,UA: NEGATIVE
RBC, UA: NEGATIVE
Specific Gravity, UA: 1.015 (ref 1.005–1.030)
Urobilinogen, Ur: 0.2 mg/dL (ref 0.2–1.0)
pH, UA: 6 (ref 5.0–7.5)

## 2023-09-29 LAB — MICROSCOPIC EXAMINATION: WBC, UA: >30 /HPF — AB (ref 0–5)

## 2023-09-29 LAB — BLADDER SCAN AMB NON-IMAGING: Scan Result: 0

## 2023-09-29 NOTE — Progress Notes (Signed)
 Bladder Scan completed today.  Patient can void prior to the bladder scan. Bladder scan result: 0ml  Performed By: Guss Bunde, CMA  Additional notes-  NP to see after

## 2023-09-29 NOTE — Progress Notes (Signed)
 Name: Manuel [name] DOB: [birth year] MRN: [id number]  History of Present Illness: Manuel [name] is a 68 y.o. male who presents today for follow up visit at [facility].  Relevant History includes: 1. BPH. - Taking Flomax  (Tamsulosin ) 0.4 mg daily. 2. Urethral stricture.  - 03/28/2022: Urethral dilation procedure by Dr. Sherrilee.  3. Kidney stone(s). 4. Phimosis.  At last visit with Dr. Nieves on 08/25/2023: - Seen for concern of malodorous urine x3 weeks. Otherwise asymptomatic.  - Per note: Urine odor - usually from some bacteria but nothing worrisome. Pt could not leave a specimen today. Discussed nature r/b/a to short course abx and he will start and drink more water  and empty more frequently. - Keflex  500 mg BID x3 days prescribed.  Today: He reports no noticeable change in his malodorous urine since taking the Keflex . Reports his urine remains a clear dark amber in color. Reports voiding 4-5x/day and confirms adequate daily fluid intake. Reports baseline urinary urgency. Denies dysuria, gross hematuria, hesitancy, straining to void, or sensations of incomplete emptying. Denies bladder or abdominal pain; denies fevers. Denies fecaluria or pneumaturia.    Medications: Current Outpatient Medications  Medication Sig Dispense Refill   allopurinol  (ZYLOPRIM ) 300 MG tablet Take 300 mg by mouth daily.     aspirin  (ASPIRIN  EC) 81 MG EC tablet Take 81 mg by mouth daily. Swallow whole. 30 tablet 12   DULoxetine  (CYMBALTA ) 30 MG capsule Take 30 mg by mouth daily.     folic acid  (FOLVITE ) 1 MG tablet Take 1 tablet (1 mg total) by mouth daily. 30 tablet 2   furosemide  (LASIX ) 20 MG tablet Take 1 tablet (20 mg total) by mouth daily. 30 tablet 11   insulin  aspart protamine - aspart (NOVOLOG  70/30 FLEXPEN) (70-30) 100 UNIT/ML FlexPen Inject 100 Units into the skin 2 (two) times daily.     insulin  glargine (LANTUS ) 100 UNIT/ML injection Inject 100 Units into the skin daily.      losartan (COZAAR) 100 MG tablet Take 100 mg by mouth daily.     meloxicam  (MOBIC ) 7.5 MG tablet Take 1 tablet (7.5 mg total) by mouth daily. 30 tablet 5   metFORMIN  (GLUCOPHAGE ) 500 MG tablet Take 1 tablet (500 mg total) by mouth 2 (two) times daily with a meal. (Patient taking differently: Take 500 mg by mouth daily with breakfast.) 60 tablet 0   metoprolol  tartrate (LOPRESSOR ) 25 MG tablet Take 0.5 tablets (12.5 mg total) by mouth 2 (two) times daily. 60 tablet 1   MOUNJARO 5 MG/0.5ML Pen Inject 5 mg into the skin once a week.     pantoprazole  (PROTONIX ) 40 MG tablet Take 1 tablet (40 mg total) by mouth 2 (two) times daily. (Patient taking differently: Take 40 mg by mouth daily.) 60 tablet 2   potassium chloride SA (KLOR-CON M) 20 MEQ tablet Take 20 mEq by mouth 2 (two) times daily.     pregabalin  (LYRICA ) 300 MG capsule Take 300 mg by mouth 2 (two) times daily.     rosuvastatin  (CRESTOR ) 5 MG tablet Take 5 mg by mouth daily.     tamsulosin  (FLOMAX ) 0.4 MG CAPS capsule TAKE 1 CAPSULE BY MOUTH ONCE DAILY AFTER SUPPER 90 capsule 0   thiamine  (VITAMIN B-1) 100 MG tablet Take 1 tablet (100 mg total) by mouth daily. 30 tablet 2   triamcinolone cream (KENALOG) 0.1 % Apply 1 Application topically 2 (two) times daily.     No current facility-administered medications for this visit.  Allergies: No Known Allergies  Past Medical History:  Diagnosis Date   CKD (chronic kidney disease) 01/08/2017   patient states he does not have.   Diabetes mellitus without complication (HCC)    Essential hypertension, benign 01/08/2017   Gout    High cholesterol 01/08/2017   Hypertension    Obesity    Past Surgical History:  Procedure Laterality Date   CIRCUMCISION     COLONOSCOPY N/A 01/14/2017   Procedure: COLONOSCOPY;  Surgeon: Golda Claudis PENNER, MD;  Location: [facility];  Service: Endoscopy;  Laterality: N/A;   CYSTOSCOPY WITH URETHRAL DILATATION N/A 03/28/2022   Procedure: CYSTOSCOPY WITH URETHRAL  DILATATION;  Surgeon: Sherrilee Belvie CROME, MD;  Location: [facility];  Service: Urology;  Laterality: N/A;   DENTAL SURGERY     teeth removed   Family History  Problem Relation Age of Onset   Colon cancer Brother    Social History   Socioeconomic History   Marital status: Married    Spouse name: Not on file   Number of children: Not on file   Years of education: Not on file   Highest education level: Not on file  Occupational History   Not on file  Tobacco Use   Smoking status: Never    Passive exposure: Never   Smokeless tobacco: Current    Types: Chew  Vaping Use   Vaping status: Never Used  Substance and Sexual Activity   Alcohol  use: Yes    Comment: a couple of beers a day   Drug use: No   Sexual activity: Yes    Birth control/protection: None  Other Topics Concern   Not on file  Social History Narrative   Not on file   Social Drivers of Health   Financial Resource Strain: Not on file  Food Insecurity: No Food Insecurity (03/12/2023)   Hunger Vital Sign    Worried About Running Out of Food in the Last Year: Never true    Ran Out of Food in the Last Year: Never true  Transportation Needs: No Transportation Needs (03/12/2023)   PRAPARE - Administrator, Civil Service (Medical): No    Lack of Transportation (Non-Medical): No  Physical Activity: Not on file  Stress: Not on file  Social Connections: Not on file  Intimate Partner Violence: Not At Risk (03/12/2023)   Humiliation, Afraid, Rape, and Kick questionnaire    Fear of Current or Ex-Partner: No    Emotionally Abused: No    Physically Abused: No    Sexually Abused: No    Review of Systems Constitutional: Patient denies any unintentional weight loss or change in strength lntegumentary: Patient denies any rashes or pruritus Cardiovascular: Patient denies chest pain or syncope Respiratory: Patient denies shortness of breath Gastrointestinal: Patient denies nausea, vomiting, constipation, or  diarrhea  Musculoskeletal: Patient denies muscle cramps or weakness Neurologic: Patient denies convulsions or seizures Allergic/Immunologic: Patient denies recent allergic reaction(s) Hematologic/Lymphatic: Patient denies bleeding tendencies Endocrine: Patient denies heat/cold intolerance  GU: As per HPI.  OBJECTIVE Vitals:   09/29/23 1210  BP: 108/70  Pulse: (!) 109  Temp: 97.7 F (36.5 C)   There is no height or weight on file to calculate BMI.  Physical Examination Constitutional: No obvious distress; patient is non-toxic appearing  Cardiovascular: No visible lower extremity edema.  Respiratory: The patient does not have audible wheezing/stridor; respirations do not appear labored  Gastrointestinal: Abdomen non-distended Musculoskeletal: Normal ROM of UEs  Skin: No obvious rashes/open sores  Neurologic:  CN 2-12 grossly intact Psychiatric: Answered questions appropriately with normal affect  Hematologic/Lymphatic/Immunologic: No obvious bruises or sites of spontaneous bleeding  Urine microscopy: >30 WBC/hpf, 0-2 RBC/hpf, many bacteria PVR: 0 ml  ASSESSMENT Benign prostatic hyperplasia with urinary obstruction - Plan: Urinalysis, Routine w reflex microscopic, BLADDER SCAN AMB NON-IMAGING  Malodorous urine - Plan: Urine Culture  No acute findings. Discussed possible causes for malodorous urine. Abnormal UA today; will check urine culture and treat as indicated based on results. If negative he was advised to continue with adequate daily fluid intake and voiding at regular intervals throughout the day to minimize urine concentration. Will plan for follow up in 6 months with Dr. Sherrilee or sooner if needed. Patient verbalized understanding of and agreement with current plan. All questions were answered.  PLAN Advised the following: 1. Urine culture. 2. Continue Flomax  (Tamsulosin ) 0.4 mg daily for BPH. 3. Return in about 6 months (around 03/31/2024) for f/u with Dr.  Sherrilee.  Orders Placed This Encounter  Procedures   Urine Culture   Urinalysis, Routine w reflex microscopic   BLADDER SCAN AMB NON-IMAGING    It has been explained that the patient is to follow regularly with their PCP in addition to all other providers involved in their care and to follow instructions provided by these respective offices. Patient advised to contact urology clinic if any urologic-pertaining questions, concerns, new symptoms or problems arise in the interim period.  There are no Patient Instructions on file for this visit.  Electronically signed by:  Lauraine JAYSON Oz, FNP   09/29/23    12:17 PM

## 2023-10-02 ENCOUNTER — Other Ambulatory Visit: Payer: Self-pay | Admitting: Urology

## 2023-10-02 ENCOUNTER — Ambulatory Visit: Payer: Self-pay | Admitting: Urology

## 2023-10-02 DIAGNOSIS — R8279 Other abnormal findings on microbiological examination of urine: Secondary | ICD-10-CM

## 2023-10-02 DIAGNOSIS — R8290 Unspecified abnormal findings in urine: Secondary | ICD-10-CM

## 2023-10-02 LAB — URINE CULTURE

## 2023-10-02 MED ORDER — DOXYCYCLINE HYCLATE 100 MG PO CAPS: 100.0000 mg | ORAL_CAPSULE | Freq: Two times a day (BID) | ORAL | 0 refills | Status: AC

## 2023-10-03 NOTE — Telephone Encounter (Signed)
 Tried calling pt with no answer, unable to leaving vm. Letter mailed out.

## 2023-10-03 NOTE — Telephone Encounter (Signed)
-----   Message from Lauraine JAYSON Oz sent at 10/02/2023  5:29 PM EDT ----- Please let pt know urine culture result. I have sent prescription for Doxycycline . Thanks.  ----- Message ----- From: Rebecka Memos Lab Results In Sent: 09/29/2023   3:35 PM EDT To: Sarah C Larocco, FNP

## 2023-10-09 DIAGNOSIS — M79675 Pain in left toe(s): Secondary | ICD-10-CM | POA: Diagnosis not present

## 2023-10-09 DIAGNOSIS — M79674 Pain in right toe(s): Secondary | ICD-10-CM | POA: Diagnosis not present

## 2023-10-09 DIAGNOSIS — B351 Tinea unguium: Secondary | ICD-10-CM | POA: Diagnosis not present

## 2023-10-09 DIAGNOSIS — L84 Corns and callosities: Secondary | ICD-10-CM | POA: Diagnosis not present

## 2023-10-09 DIAGNOSIS — E1142 Type 2 diabetes mellitus with diabetic polyneuropathy: Secondary | ICD-10-CM | POA: Diagnosis not present

## 2023-11-05 DIAGNOSIS — Z299 Encounter for prophylactic measures, unspecified: Secondary | ICD-10-CM | POA: Diagnosis not present

## 2023-11-05 DIAGNOSIS — I509 Heart failure, unspecified: Secondary | ICD-10-CM | POA: Diagnosis not present

## 2023-11-05 DIAGNOSIS — E1142 Type 2 diabetes mellitus with diabetic polyneuropathy: Secondary | ICD-10-CM | POA: Diagnosis not present

## 2023-11-05 DIAGNOSIS — R8290 Unspecified abnormal findings in urine: Secondary | ICD-10-CM | POA: Diagnosis not present

## 2023-11-05 DIAGNOSIS — I10 Essential (primary) hypertension: Secondary | ICD-10-CM | POA: Diagnosis not present

## 2023-11-05 DIAGNOSIS — E119 Type 2 diabetes mellitus without complications: Secondary | ICD-10-CM | POA: Diagnosis not present

## 2023-11-05 DIAGNOSIS — N183 Chronic kidney disease, stage 3 unspecified: Secondary | ICD-10-CM | POA: Diagnosis not present

## 2023-11-27 DIAGNOSIS — H52223 Regular astigmatism, bilateral: Secondary | ICD-10-CM | POA: Diagnosis not present

## 2023-11-27 DIAGNOSIS — H524 Presbyopia: Secondary | ICD-10-CM | POA: Diagnosis not present

## 2023-11-27 DIAGNOSIS — D3132 Benign neoplasm of left choroid: Secondary | ICD-10-CM | POA: Diagnosis not present

## 2023-11-27 DIAGNOSIS — H5202 Hypermetropia, left eye: Secondary | ICD-10-CM | POA: Diagnosis not present

## 2023-12-01 ENCOUNTER — Other Ambulatory Visit: Payer: Self-pay | Admitting: Urology

## 2023-12-18 DIAGNOSIS — M79674 Pain in right toe(s): Secondary | ICD-10-CM | POA: Diagnosis not present

## 2023-12-18 DIAGNOSIS — B351 Tinea unguium: Secondary | ICD-10-CM | POA: Diagnosis not present

## 2023-12-18 DIAGNOSIS — M79675 Pain in left toe(s): Secondary | ICD-10-CM | POA: Diagnosis not present

## 2023-12-18 DIAGNOSIS — E1142 Type 2 diabetes mellitus with diabetic polyneuropathy: Secondary | ICD-10-CM | POA: Diagnosis not present

## 2023-12-18 DIAGNOSIS — L84 Corns and callosities: Secondary | ICD-10-CM | POA: Diagnosis not present

## 2024-02-12 ENCOUNTER — Telehealth: Payer: Self-pay | Admitting: Urology

## 2024-02-12 NOTE — Telephone Encounter (Signed)
 Wife says urine stinks so bad it will make you puke. I asked if he is having any other symptoms and she said probably but he will not tell her all of them

## 2024-02-13 ENCOUNTER — Other Ambulatory Visit: Payer: Self-pay

## 2024-02-13 DIAGNOSIS — R8290 Unspecified abnormal findings in urine: Secondary | ICD-10-CM

## 2024-02-13 NOTE — Telephone Encounter (Signed)
 Dysuria  Patient called with c/o dysuria x 3 weeks with urine smelling bad Pain: no  Severity:0  Associated Signs and Symptoms:  Fever: noTemp.0 Chills: yes cold natura Hematuria: no Urgency: yes Frequency: yes Hesitancy:yes Incontinence: yes Nausea: no Vomiting: no  Urologic History:  Any Recent Urologic Surgeries or Procedures:no Recurrent UTI's:no Cystitis: no  Prostatitis:no Kidney or Bladder Stones: no Plan: Walk-in Clinic: no Appointment w/Physician: [no Lab visit scheduled for urine drop off: Yes Advice given:  Do you take on daily medications for UTI suppression No

## 2024-02-16 ENCOUNTER — Other Ambulatory Visit

## 2024-02-16 ENCOUNTER — Telehealth: Payer: Self-pay

## 2024-02-16 DIAGNOSIS — R8290 Unspecified abnormal findings in urine: Secondary | ICD-10-CM

## 2024-02-16 LAB — URINALYSIS, ROUTINE W REFLEX MICROSCOPIC
Bilirubin, UA: NEGATIVE
Glucose, UA: NEGATIVE
Ketones, UA: NEGATIVE
Nitrite, UA: NEGATIVE
Protein,UA: NEGATIVE
RBC, UA: NEGATIVE
Specific Gravity, UA: 1.015 (ref 1.005–1.030)
Urobilinogen, Ur: 2 mg/dL — ABNORMAL HIGH (ref 0.2–1.0)
pH, UA: 6 (ref 5.0–7.5)

## 2024-02-16 LAB — MICROSCOPIC EXAMINATION: WBC, UA: >30 /HPF — AB (ref 0–5)

## 2024-02-16 MED ORDER — DOXYCYCLINE HYCLATE 100 MG PO CAPS: 100.0000 mg | ORAL_CAPSULE | Freq: Two times a day (BID) | ORAL | 0 refills | Status: AC

## 2024-02-16 NOTE — Telephone Encounter (Signed)
 Patient presents today with complaints of  Urine smell bad and urgency, frequency.  UA and Culture done today.  Dr. Sherrilee  reviewed results and Doxycycline  100 mg BID X 7 days .  Patient aware of MD recommendations and that we will reach out with culture results.      [id number], CMA

## 2024-02-17 ENCOUNTER — Other Ambulatory Visit: Payer: Self-pay | Admitting: Orthopedic Surgery

## 2024-02-17 DIAGNOSIS — M48062 Spinal stenosis, lumbar region with neurogenic claudication: Secondary | ICD-10-CM

## 2024-02-17 DIAGNOSIS — M1711 Unilateral primary osteoarthritis, right knee: Secondary | ICD-10-CM

## 2024-02-17 DIAGNOSIS — M1712 Unilateral primary osteoarthritis, left knee: Secondary | ICD-10-CM

## 2024-02-19 LAB — URINE CULTURE

## 2024-02-24 ENCOUNTER — Ambulatory Visit: Payer: Self-pay

## 2024-02-25 DIAGNOSIS — Z713 Dietary counseling and surveillance: Secondary | ICD-10-CM | POA: Diagnosis not present

## 2024-02-25 DIAGNOSIS — R5383 Other fatigue: Secondary | ICD-10-CM | POA: Diagnosis not present

## 2024-02-25 DIAGNOSIS — Z0000 Encounter for general adult medical examination without abnormal findings: Secondary | ICD-10-CM | POA: Diagnosis not present

## 2024-02-25 DIAGNOSIS — Z125 Encounter for screening for malignant neoplasm of prostate: Secondary | ICD-10-CM | POA: Diagnosis not present

## 2024-02-25 DIAGNOSIS — E7800 Pure hypercholesterolemia, unspecified: Secondary | ICD-10-CM | POA: Diagnosis not present

## 2024-02-25 DIAGNOSIS — I10 Essential (primary) hypertension: Secondary | ICD-10-CM | POA: Diagnosis not present

## 2024-02-25 DIAGNOSIS — E119 Type 2 diabetes mellitus without complications: Secondary | ICD-10-CM | POA: Diagnosis not present

## 2024-02-25 DIAGNOSIS — I509 Heart failure, unspecified: Secondary | ICD-10-CM | POA: Diagnosis not present

## 2024-02-25 DIAGNOSIS — N1831 Chronic kidney disease, stage 3a: Secondary | ICD-10-CM | POA: Diagnosis not present

## 2024-02-26 DIAGNOSIS — B351 Tinea unguium: Secondary | ICD-10-CM | POA: Diagnosis not present

## 2024-02-26 DIAGNOSIS — L84 Corns and callosities: Secondary | ICD-10-CM | POA: Diagnosis not present

## 2024-02-26 DIAGNOSIS — E1142 Type 2 diabetes mellitus with diabetic polyneuropathy: Secondary | ICD-10-CM | POA: Diagnosis not present

## 2024-02-26 DIAGNOSIS — M79675 Pain in left toe(s): Secondary | ICD-10-CM | POA: Diagnosis not present

## 2024-02-26 DIAGNOSIS — M79674 Pain in right toe(s): Secondary | ICD-10-CM | POA: Diagnosis not present

## 2024-03-01 ENCOUNTER — Other Ambulatory Visit: Payer: Self-pay | Admitting: Urology

## 2024-03-01 ENCOUNTER — Other Ambulatory Visit: Payer: Self-pay | Admitting: Student

## 2024-03-21 ENCOUNTER — Other Ambulatory Visit: Payer: Self-pay | Admitting: Orthopedic Surgery

## 2024-03-21 DIAGNOSIS — M48062 Spinal stenosis, lumbar region with neurogenic claudication: Secondary | ICD-10-CM

## 2024-03-21 DIAGNOSIS — M1711 Unilateral primary osteoarthritis, right knee: Secondary | ICD-10-CM

## 2024-03-21 DIAGNOSIS — M1712 Unilateral primary osteoarthritis, left knee: Secondary | ICD-10-CM

## 2024-04-02 ENCOUNTER — Ambulatory Visit: Admitting: Urology

## 2024-04-20 ENCOUNTER — Other Ambulatory Visit: Payer: Self-pay | Admitting: Orthopedic Surgery

## 2024-04-20 DIAGNOSIS — M1712 Unilateral primary osteoarthritis, left knee: Secondary | ICD-10-CM

## 2024-04-20 DIAGNOSIS — M48062 Spinal stenosis, lumbar region with neurogenic claudication: Secondary | ICD-10-CM

## 2024-04-20 DIAGNOSIS — M1711 Unilateral primary osteoarthritis, right knee: Secondary | ICD-10-CM

## 2024-10-06 ENCOUNTER — Ambulatory Visit: Admitting: Urology
